# Patient Record
Sex: Female | Born: 1965 | Race: White | Hispanic: No | Marital: Married | Smoking: Former smoker
Health system: Southern US, Community
[De-identification: ages and names within clinical notes are randomized; demographics above are authoritative.]

## PROBLEM LIST (undated history)

## (undated) DIAGNOSIS — M8580 Other specified disorders of bone density and structure, unspecified site: Secondary | ICD-10-CM

## (undated) DIAGNOSIS — R9431 Abnormal electrocardiogram [ECG] [EKG]: Secondary | ICD-10-CM

## (undated) DIAGNOSIS — K589 Irritable bowel syndrome without diarrhea: Secondary | ICD-10-CM

## (undated) DIAGNOSIS — C801 Malignant (primary) neoplasm, unspecified: Secondary | ICD-10-CM

## (undated) DIAGNOSIS — K5790 Diverticulosis of intestine, part unspecified, without perforation or abscess without bleeding: Secondary | ICD-10-CM

## (undated) DIAGNOSIS — M810 Age-related osteoporosis without current pathological fracture: Secondary | ICD-10-CM

## (undated) DIAGNOSIS — M1990 Unspecified osteoarthritis, unspecified site: Secondary | ICD-10-CM

## (undated) DIAGNOSIS — K219 Gastro-esophageal reflux disease without esophagitis: Secondary | ICD-10-CM

## (undated) DIAGNOSIS — T7840XA Allergy, unspecified, initial encounter: Secondary | ICD-10-CM

## (undated) DIAGNOSIS — F32A Depression, unspecified: Secondary | ICD-10-CM

## (undated) DIAGNOSIS — F419 Anxiety disorder, unspecified: Secondary | ICD-10-CM

## (undated) DIAGNOSIS — N6320 Unspecified lump in the left breast, unspecified quadrant: Secondary | ICD-10-CM

## (undated) DIAGNOSIS — D126 Benign neoplasm of colon, unspecified: Secondary | ICD-10-CM

## (undated) DIAGNOSIS — E785 Hyperlipidemia, unspecified: Secondary | ICD-10-CM

## (undated) HISTORY — DX: Irritable bowel syndrome, unspecified: K58.9

## (undated) HISTORY — PX: OOPHORECTOMY: SHX86

## (undated) HISTORY — DX: Hyperlipidemia, unspecified: E78.5

## (undated) HISTORY — DX: Abnormal electrocardiogram (ECG) (EKG): R94.31

## (undated) HISTORY — DX: Diverticulosis of intestine, part unspecified, without perforation or abscess without bleeding: K57.90

## (undated) HISTORY — PX: TUBAL LIGATION: SHX77

## (undated) HISTORY — DX: Allergy, unspecified, initial encounter: T78.40XA

## (undated) HISTORY — DX: Benign neoplasm of colon, unspecified: D12.6

## (undated) HISTORY — PX: VAGINAL HYSTERECTOMY: SHX2639

## (undated) HISTORY — DX: Anxiety disorder, unspecified: F41.9

## (undated) HISTORY — DX: Age-related osteoporosis without current pathological fracture: M81.0

## (undated) HISTORY — DX: Depression, unspecified: F32.A

## (undated) HISTORY — DX: Malignant (primary) neoplasm, unspecified: C80.1

## (undated) HISTORY — PX: WISDOM TOOTH EXTRACTION: SHX21

## (undated) HISTORY — DX: Other specified disorders of bone density and structure, unspecified site: M85.80

## (undated) HISTORY — DX: Gastro-esophageal reflux disease without esophagitis: K21.9

## (undated) HISTORY — DX: Unspecified lump in the left breast, unspecified quadrant: N63.20

## (undated) HISTORY — DX: Unspecified osteoarthritis, unspecified site: M19.90

## (undated) HISTORY — PX: OTHER SURGICAL HISTORY: SHX169

---

## 2003-09-09 ENCOUNTER — Ambulatory Visit (HOSPITAL_COMMUNITY): Admission: RE | Admit: 2003-09-09 | Discharge: 2003-09-09 | Payer: Self-pay | Admitting: Family Medicine

## 2003-09-13 ENCOUNTER — Ambulatory Visit (HOSPITAL_COMMUNITY): Admission: RE | Admit: 2003-09-13 | Discharge: 2003-09-13 | Payer: Self-pay | Admitting: Family Medicine

## 2005-02-18 ENCOUNTER — Ambulatory Visit: Payer: Self-pay | Admitting: Orthopedic Surgery

## 2005-02-26 ENCOUNTER — Ambulatory Visit: Payer: Self-pay | Admitting: Orthopedic Surgery

## 2005-02-26 ENCOUNTER — Ambulatory Visit (HOSPITAL_COMMUNITY): Admission: RE | Admit: 2005-02-26 | Discharge: 2005-02-26 | Payer: Self-pay | Admitting: Orthopedic Surgery

## 2005-03-01 ENCOUNTER — Ambulatory Visit: Payer: Self-pay | Admitting: Orthopedic Surgery

## 2005-03-10 ENCOUNTER — Ambulatory Visit: Payer: Self-pay | Admitting: Orthopedic Surgery

## 2005-04-05 ENCOUNTER — Ambulatory Visit: Payer: Self-pay | Admitting: Orthopedic Surgery

## 2005-04-15 ENCOUNTER — Encounter (HOSPITAL_COMMUNITY): Admission: RE | Admit: 2005-04-15 | Discharge: 2005-05-15 | Payer: Self-pay | Admitting: Orthopedic Surgery

## 2005-05-03 ENCOUNTER — Ambulatory Visit: Payer: Self-pay | Admitting: Orthopedic Surgery

## 2005-09-27 ENCOUNTER — Ambulatory Visit: Payer: Self-pay | Admitting: Orthopedic Surgery

## 2005-10-27 ENCOUNTER — Ambulatory Visit (HOSPITAL_COMMUNITY): Admission: RE | Admit: 2005-10-27 | Discharge: 2005-10-27 | Payer: Self-pay | Admitting: Family Medicine

## 2005-11-08 ENCOUNTER — Ambulatory Visit (HOSPITAL_COMMUNITY): Admission: RE | Admit: 2005-11-08 | Discharge: 2005-11-08 | Payer: Self-pay | Admitting: Family Medicine

## 2005-11-17 ENCOUNTER — Ambulatory Visit (HOSPITAL_COMMUNITY): Admission: RE | Admit: 2005-11-17 | Discharge: 2005-11-17 | Payer: Self-pay | Admitting: Family Medicine

## 2006-05-25 ENCOUNTER — Ambulatory Visit (HOSPITAL_COMMUNITY): Admission: RE | Admit: 2006-05-25 | Discharge: 2006-05-25 | Payer: Self-pay | Admitting: Neurology

## 2006-11-24 ENCOUNTER — Ambulatory Visit (HOSPITAL_COMMUNITY): Admission: RE | Admit: 2006-11-24 | Discharge: 2006-11-24 | Payer: Self-pay | Admitting: Family Medicine

## 2007-02-19 IMAGING — CT CT HEAD W/O CM
1 series · 16 of 30 positions shown, 20 images · non-contrast
Comparison: none

CLINICAL DATA: Headaches

[Series 9049: — · axial · 0.48mm/px · z∈[-652,-517]mm · 16 of 30 slices shown, 20 images]
[im 2/30  brain]
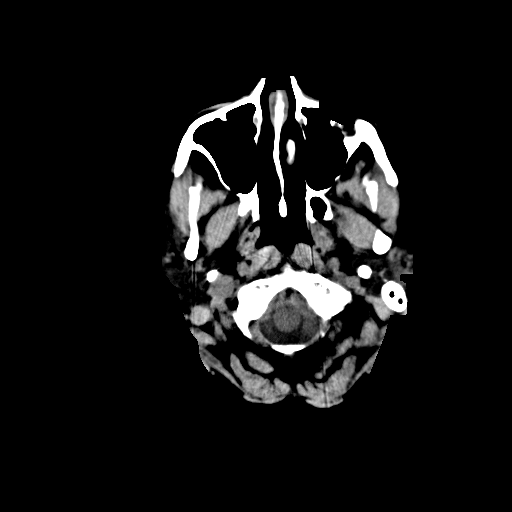
[im 2/30  bone]
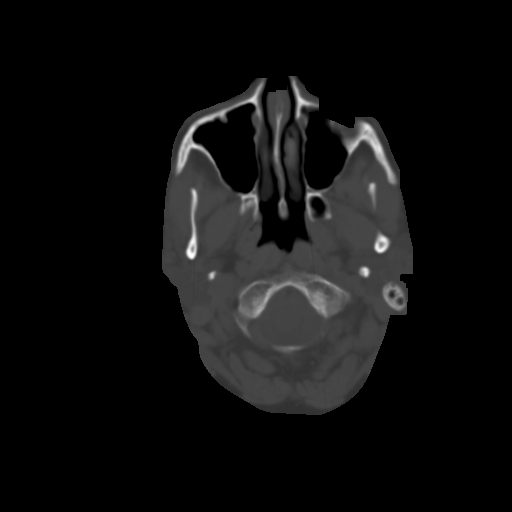
[im 4/30  brain]
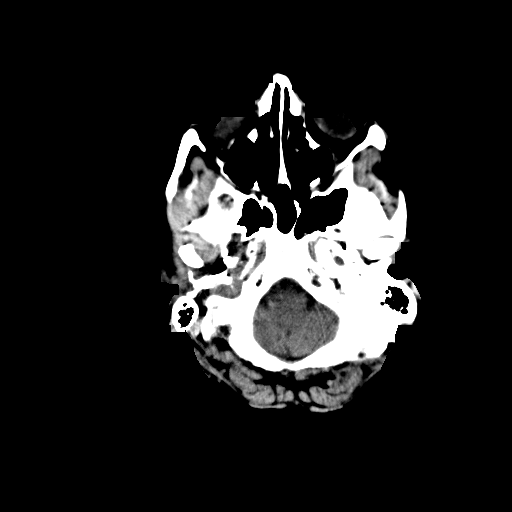
[im 6/30  brain]
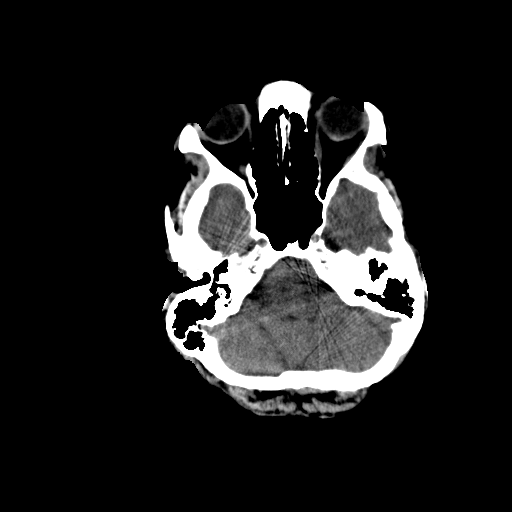
[im 8/30  brain]
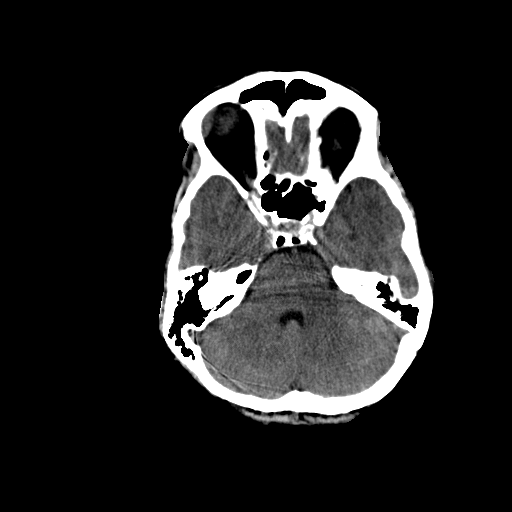
[im 9/30  brain]
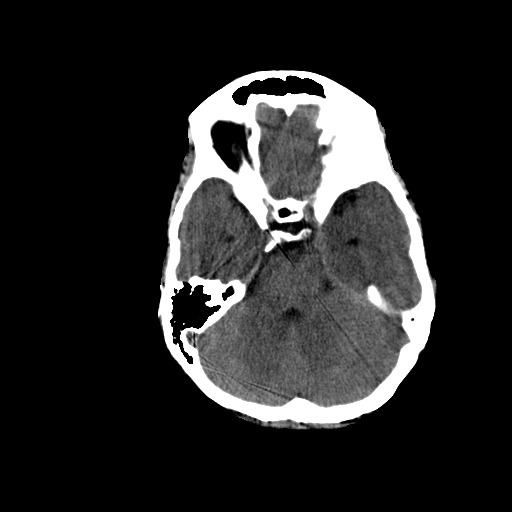
[im 9/30  bone]
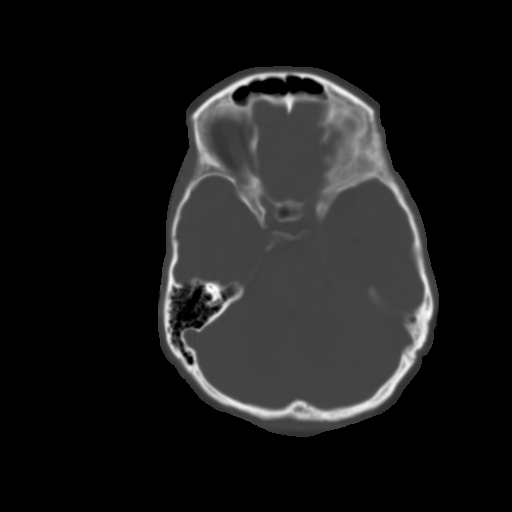
[im 11/30  brain]
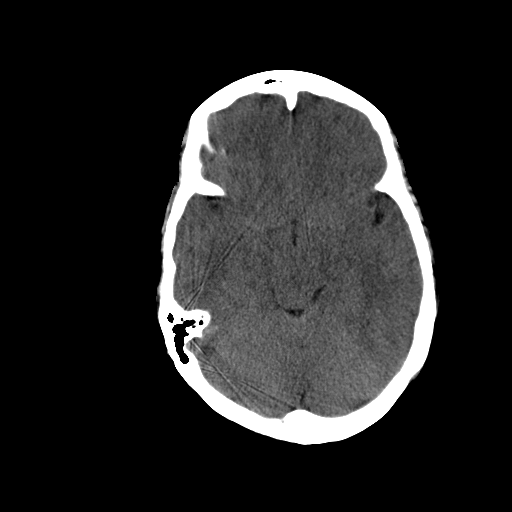
[im 13/30  brain]
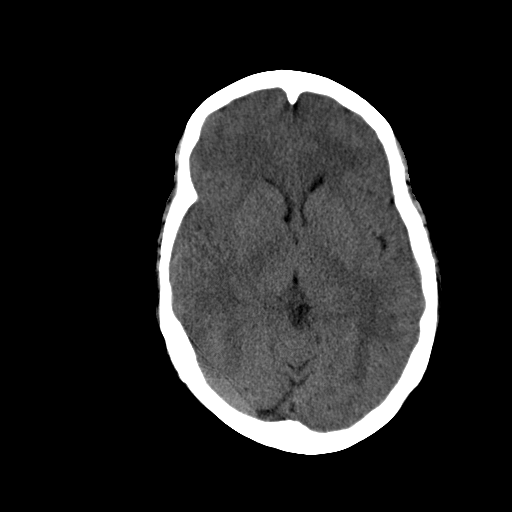
[im 15/30  brain]
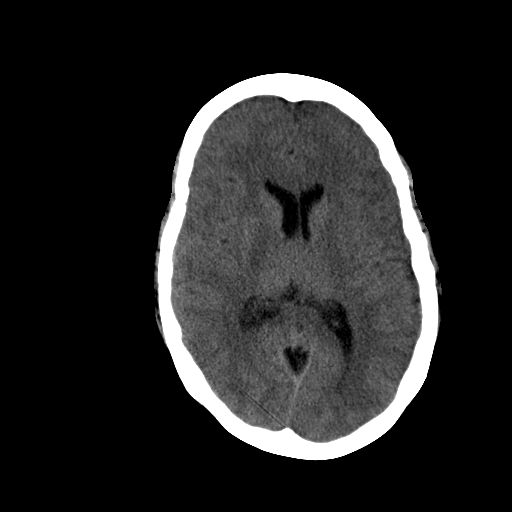
[im 16/30  brain]
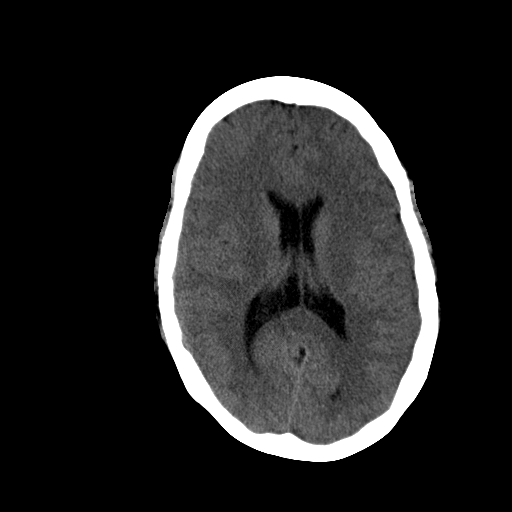
[im 16/30  bone]
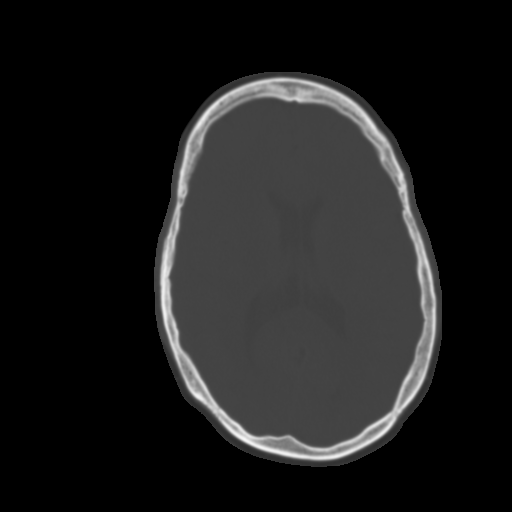
[im 18/30  brain]
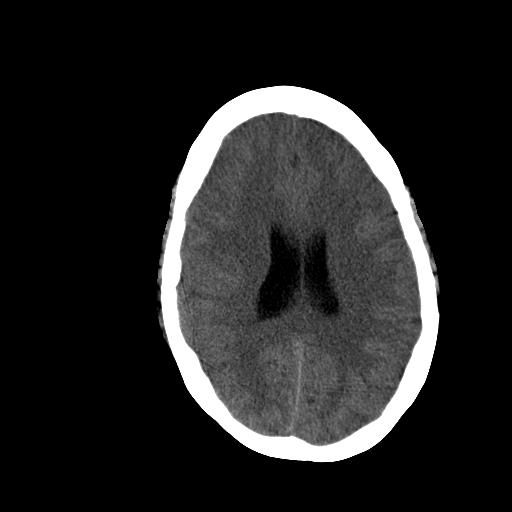
[im 20/30  brain]
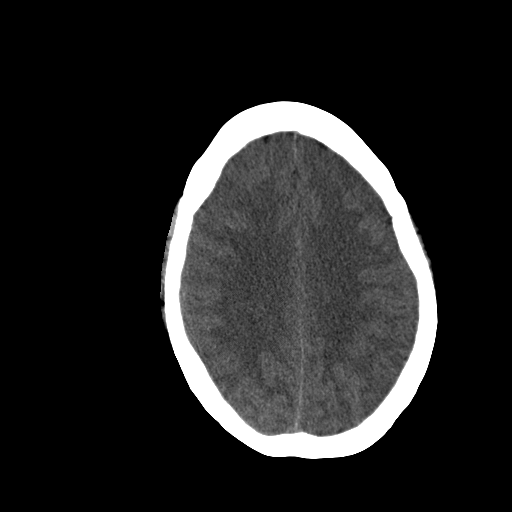
[im 22/30  brain]
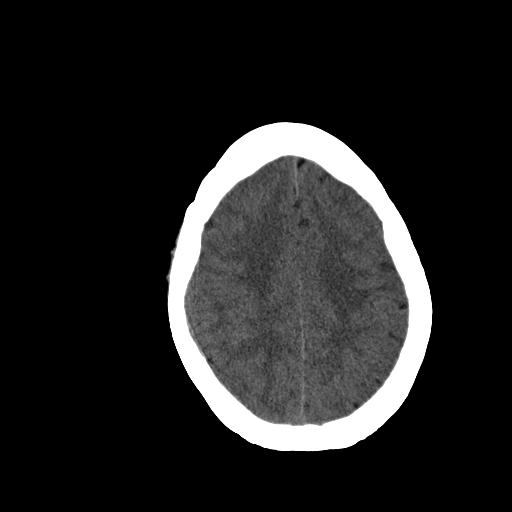
[im 23/30  brain]
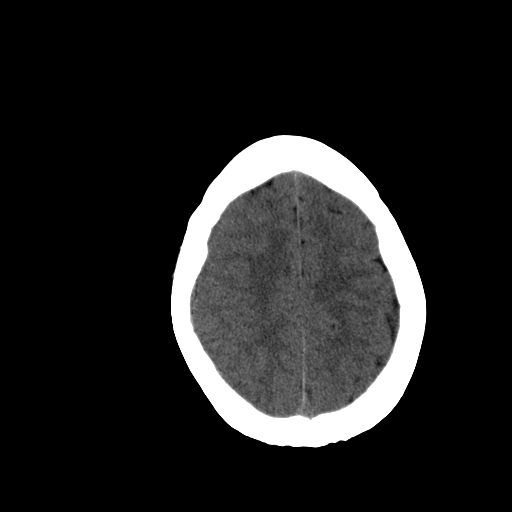
[im 23/30  bone]
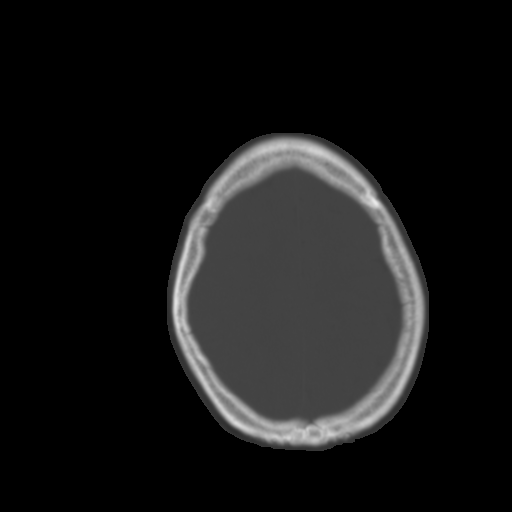
[im 25/30  brain]
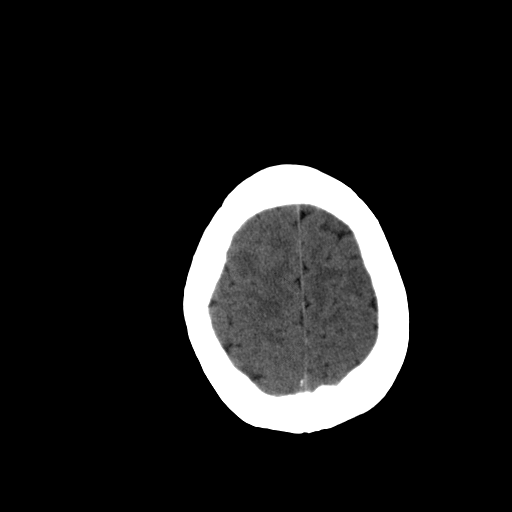
[im 27/30  brain]
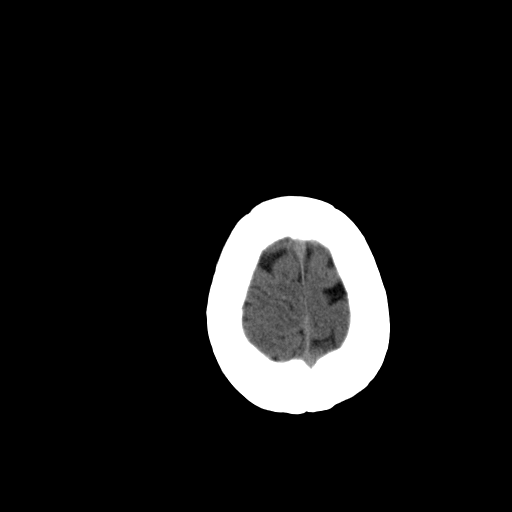
[im 29/30  brain]
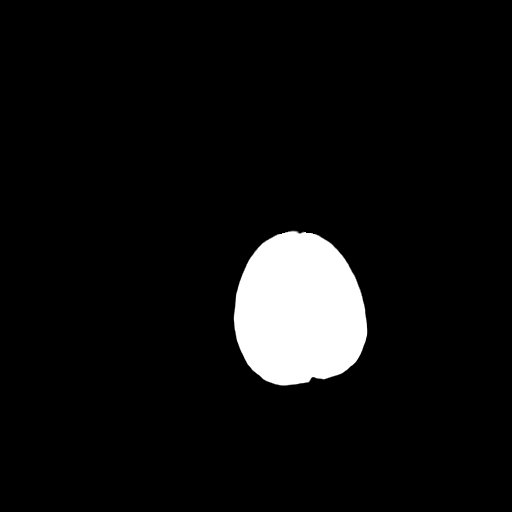

[16 of 30 positions shown; findings below may reference images not displayed]

CT head without contrast:

No previous for comparison. There is no evidence of acute intracranial
hemorrhage, brain edema,  mass effect, or midline shift.   No other intra-axial
abnormalities are seen, and the ventricles and sulci are within normal limits in
size and symmetry.   No abnormal extra-axial fluid collections or masses are
identified.  No skull abnormalities are noted.
IMPRESSION: 1. Negative non-contrast head CT.

## 2007-03-19 ENCOUNTER — Emergency Department: Payer: Self-pay | Admitting: Emergency Medicine

## 2009-08-05 ENCOUNTER — Ambulatory Visit (HOSPITAL_COMMUNITY): Admission: RE | Admit: 2009-08-05 | Discharge: 2009-08-05 | Payer: Self-pay | Admitting: Internal Medicine

## 2009-08-14 ENCOUNTER — Ambulatory Visit (HOSPITAL_COMMUNITY): Admission: RE | Admit: 2009-08-14 | Discharge: 2009-08-14 | Payer: Self-pay | Admitting: Internal Medicine

## 2009-11-21 ENCOUNTER — Ambulatory Visit (HOSPITAL_COMMUNITY): Admission: RE | Admit: 2009-11-21 | Discharge: 2009-11-21 | Payer: Self-pay | Admitting: Internal Medicine

## 2010-08-05 ENCOUNTER — Encounter: Payer: Self-pay | Admitting: Gastroenterology

## 2010-08-12 ENCOUNTER — Encounter: Payer: Self-pay | Admitting: Gastroenterology

## 2010-09-17 DIAGNOSIS — G43909 Migraine, unspecified, not intractable, without status migrainosus: Secondary | ICD-10-CM

## 2010-09-17 DIAGNOSIS — E785 Hyperlipidemia, unspecified: Secondary | ICD-10-CM

## 2010-09-17 DIAGNOSIS — K219 Gastro-esophageal reflux disease without esophagitis: Secondary | ICD-10-CM | POA: Insufficient documentation

## 2010-09-22 ENCOUNTER — Ambulatory Visit: Payer: Self-pay | Admitting: Gastroenterology

## 2010-09-22 ENCOUNTER — Encounter (INDEPENDENT_AMBULATORY_CARE_PROVIDER_SITE_OTHER): Payer: Self-pay | Admitting: *Deleted

## 2010-09-22 DIAGNOSIS — N390 Urinary tract infection, site not specified: Secondary | ICD-10-CM

## 2010-09-22 DIAGNOSIS — K589 Irritable bowel syndrome without diarrhea: Secondary | ICD-10-CM

## 2010-09-22 LAB — CONVERTED CEMR LAB
CRP, High Sensitivity: 12.16 — ABNORMAL HIGH (ref 0.00–5.00)
Ferritin: 34.7 ng/mL (ref 10.0–291.0)
Folate: 15.3 ng/mL
IgA: 148 mg/dL (ref 68–378)
Iron: 78 ug/dL (ref 42–145)
Saturation Ratios: 19.6 % — ABNORMAL LOW (ref 20.0–50.0)
Sed Rate: 18 mm/hr (ref 0–22)
Tissue Transglutaminase Ab, IgA: 14.5 units (ref <20)
Transferrin: 284.8 mg/dL (ref 212.0–360.0)
Vitamin B-12: 508 pg/mL (ref 211–911)

## 2010-09-23 ENCOUNTER — Encounter: Payer: Self-pay | Admitting: Gastroenterology

## 2010-11-09 ENCOUNTER — Ambulatory Visit
Admission: RE | Admit: 2010-11-09 | Discharge: 2010-11-09 | Payer: Self-pay | Source: Home / Self Care | Attending: Gastroenterology | Admitting: Gastroenterology

## 2010-11-09 ENCOUNTER — Encounter: Payer: Self-pay | Admitting: Gastroenterology

## 2010-11-09 HISTORY — PX: COLONOSCOPY: SHX174

## 2010-11-09 HISTORY — PX: POLYPECTOMY: SHX149

## 2010-11-13 ENCOUNTER — Encounter: Payer: Self-pay | Admitting: Gastroenterology

## 2010-11-21 ENCOUNTER — Encounter: Payer: Self-pay | Admitting: Family Medicine

## 2010-11-28 IMAGING — CR DG CHEST 2V
2 series · 2 of 2 positions shown · non-contrast
Comparison: None

CLINICAL DATA: Annual physical.

CHEST - 2 VIEW

[view not recorded (1 of 2)]
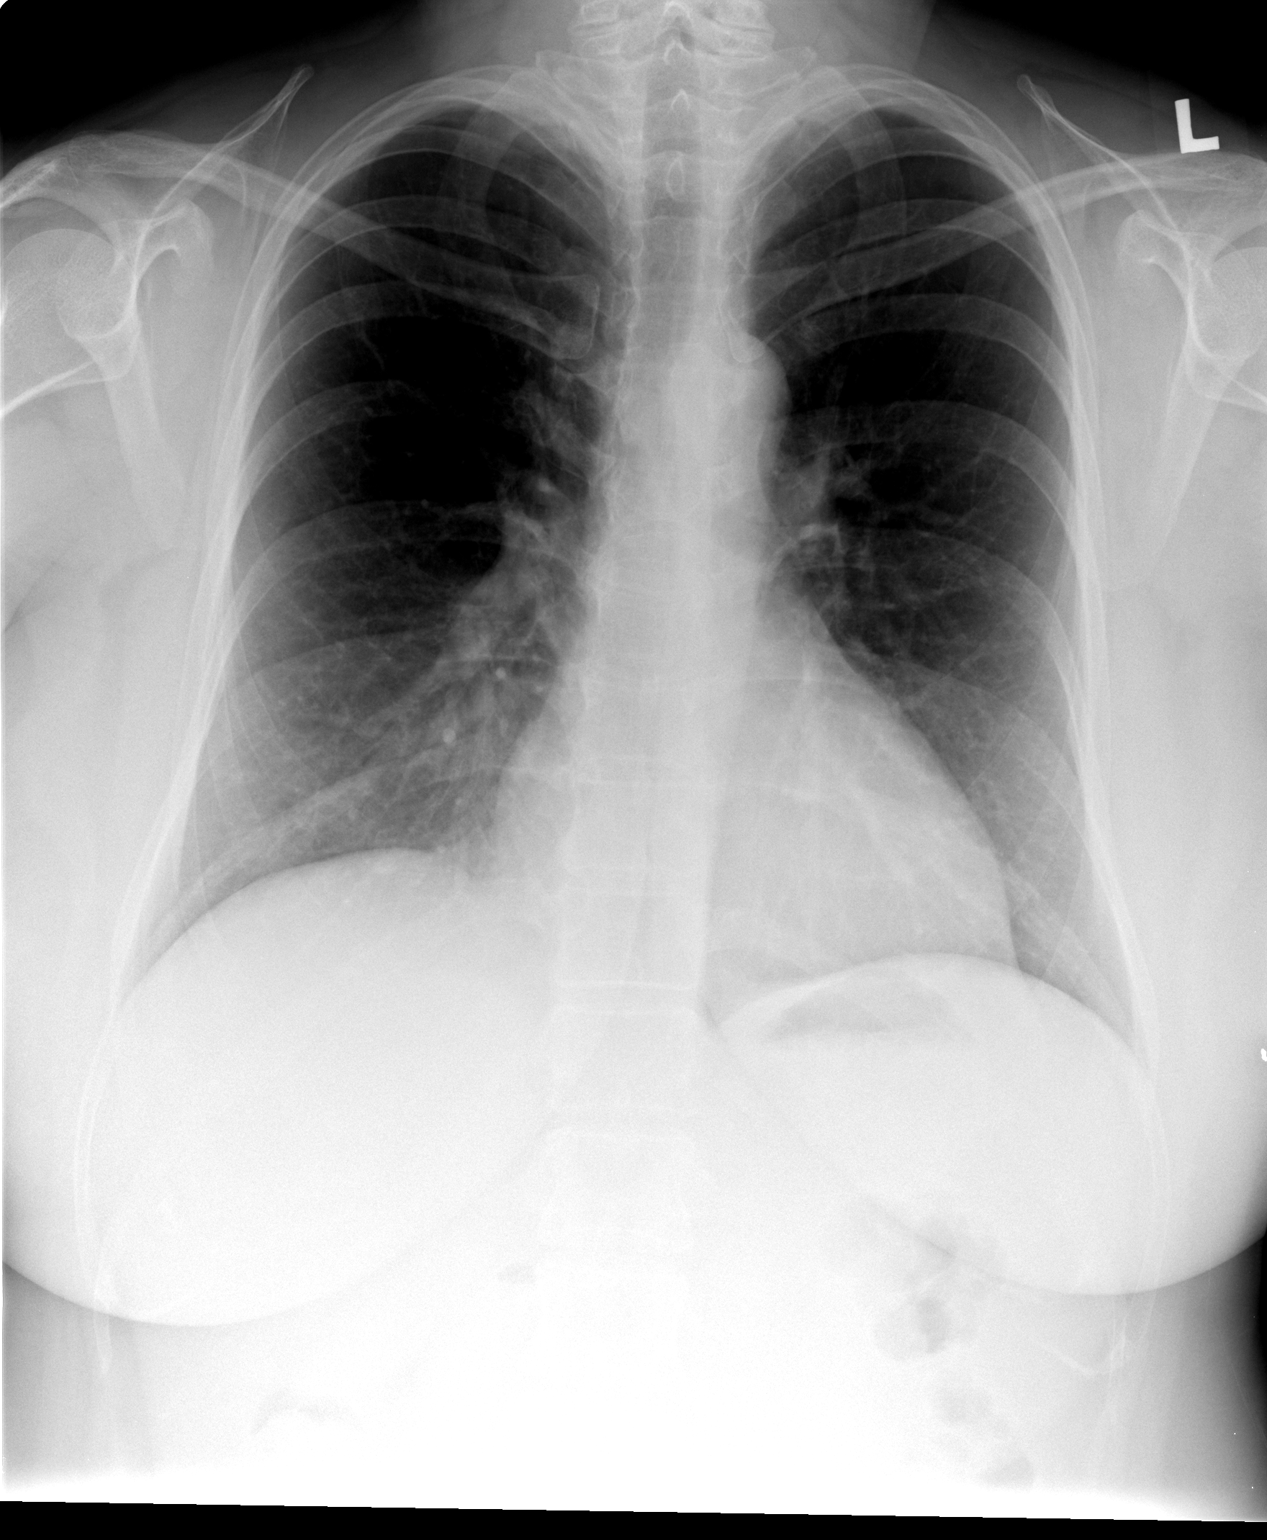

[view not recorded (2 of 2)]
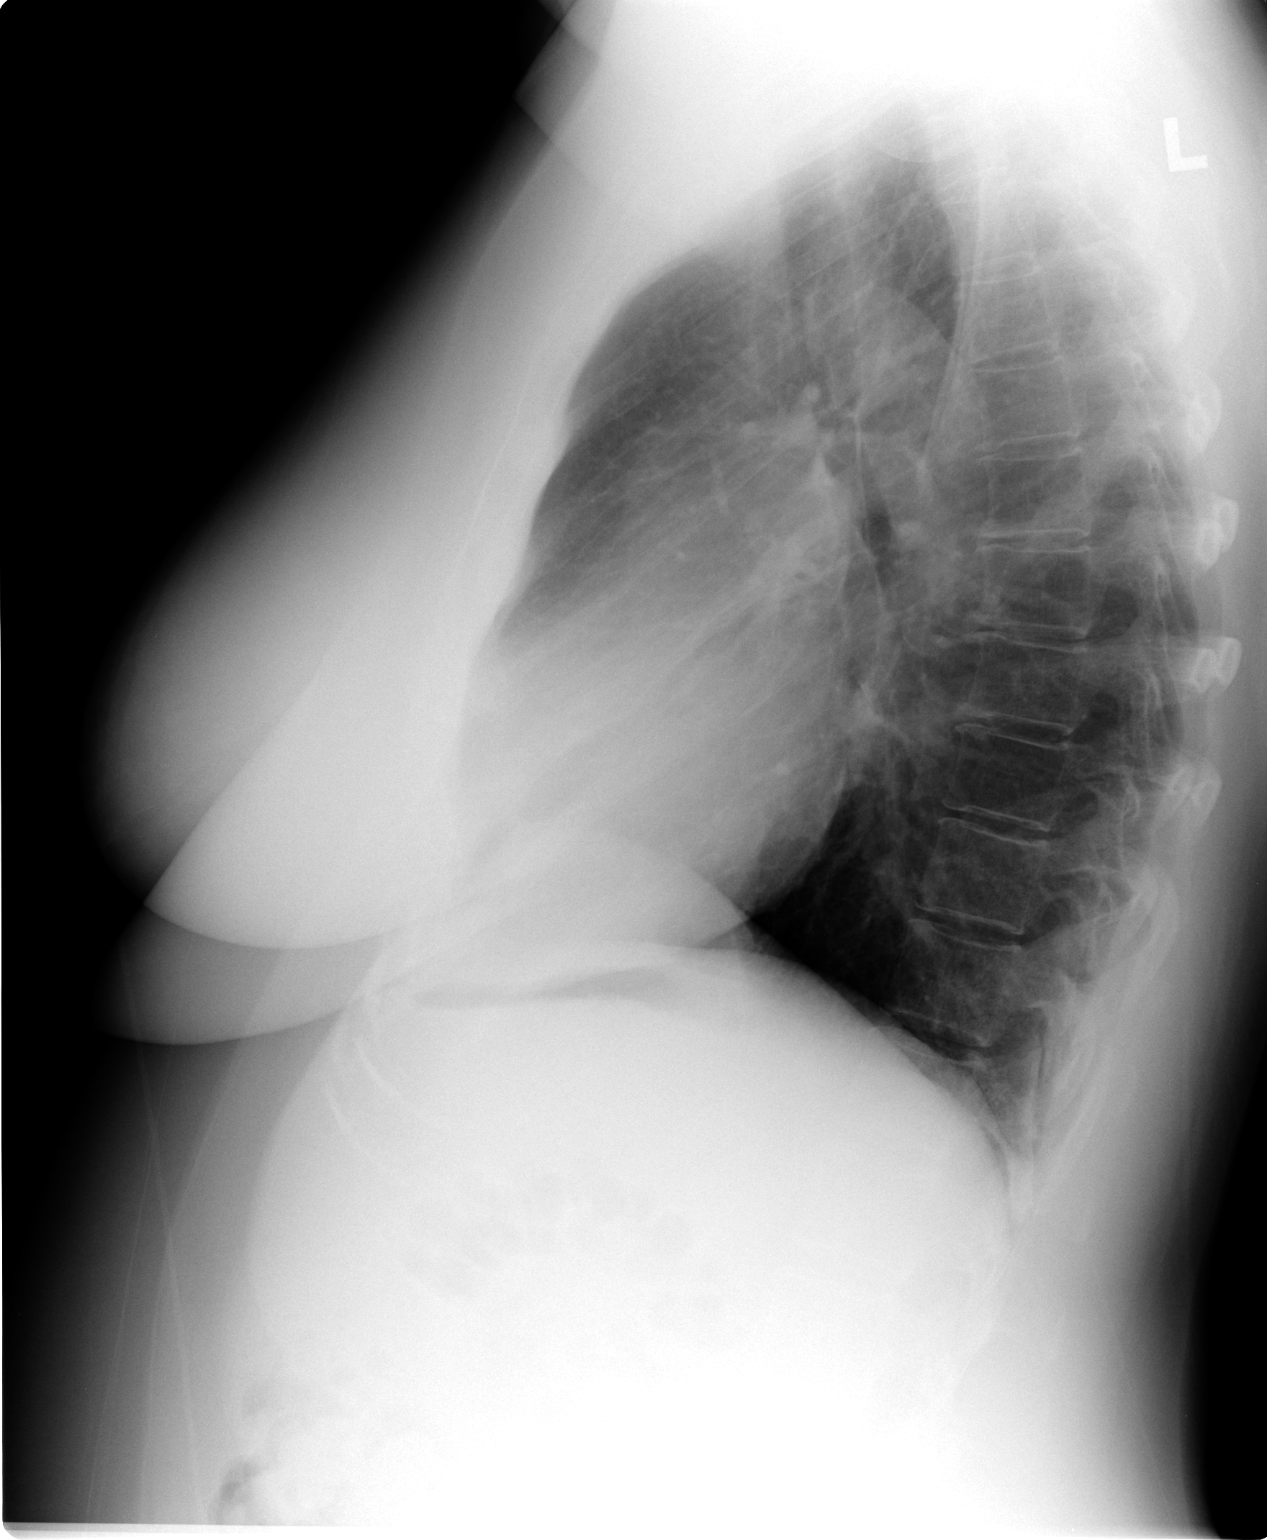

[2 of 2 positions shown; findings below may reference images not displayed]

FINDINGS: The heart size and mediastinal contours are within
normal limits.  Both lungs are clear.  The visualized skeletal
structures are unremarkable.
IMPRESSION: No active cardiopulmonary disease.

## 2010-12-01 NOTE — Letter (Signed)
Summary: [facility] Instructions  [facility]  [address] [place], [place] [address]   Phone: [phone]  Fax: [phone]       Kelly Mclaughlin    1965/11/25    MRN: [id number]        Procedure Day /Date: 10/05/2010 Monday     Arrival Time: 8:30am     Procedure Time: 9:30am     Location of Procedure:                    X  [facility] (4th Floor)   PREPARATION FOR COLONOSCOPY WITH MOVIPREP   Starting 5 days prior to your procedure 09/30/2010 do not eat nuts, seeds, popcorn, corn, beans, peas,  salads, or any raw vegetables.  Do not take any fiber supplements (e.g. Metamucil, Citrucel, and Benefiber).  THE DAY BEFORE YOUR PROCEDURE         DATE: 10/04/2010  DAY: Sunday  1.  Drink clear liquids the entire day-NO SOLID FOOD  2.  Do not drink anything colored red or purple.  Avoid juices with pulp.  No orange juice.  3.  Drink at least 64 oz. (8 glasses) of fluid/clear liquids during the day to prevent dehydration and help the prep work efficiently.  CLEAR LIQUIDS INCLUDE: Water Jello Ice Popsicles Tea (sugar ok, no milk/cream) Powdered fruit flavored drinks Coffee (sugar ok, no milk/cream) Gatorade Juice: apple, white grape, white cranberry  Lemonade Clear bullion, consomm, broth Carbonated beverages (any kind) Strained chicken noodle soup Hard Candy                             4.  In the morning, mix first dose of MoviPrep solution:    Empty 1 Pouch A and 1 Pouch B into the disposable container    Add lukewarm drinking water to the top line of the container. Mix to dissolve    Refrigerate (mixed solution should be used within 24 hrs)  5.  Begin drinking the prep at 5:00 p.m. The MoviPrep container is divided by 4 marks.   Every 15 minutes drink the solution down to the next mark (approximately 8 oz) until the full liter is complete.   6.  Follow completed prep with 16 oz of clear liquid of your choice (Nothing red or purple).   Continue to drink clear liquids until bedtime.  7.  Before going to bed, mix second dose of MoviPrep solution:    Empty 1 Pouch A and 1 Pouch B into the disposable container    Add lukewarm drinking water to the top line of the container. Mix to dissolve    Refrigerate  THE DAY OF YOUR PROCEDURE      DATE: 10/05/2010  DAY: Monday  Beginning at 4:30am (5 hours before procedure):         1. Every 15 minutes, drink the solution down to the next mark (approx 8 oz) until the full liter is complete.  2. Follow completed prep with 16 oz. of clear liquid of your choice.    3. You may drink clear liquids until 7:30am (2 HOURS BEFORE PROCEDURE).   MEDICATION INSTRUCTIONS  Unless otherwise instructed, you should take regular prescription medications with a small sip of water   as early as possible the morning of your procedure.           OTHER INSTRUCTIONS  You will need a responsible adult at least 45 years  of age to accompany you and drive you home.   This person must remain in the waiting room during your procedure.  Wear loose fitting clothing that is easily removed.  Leave jewelry and other valuables at home.  However, you may wish to bring a book to read or  an iPod/MP3 player to listen to music as you wait for your procedure to start.  Remove all body piercing jewelry and leave at home.  Total time from sign-in until discharge is approximately 2-3 hours.  You should go home directly after your procedure and rest.  You can resume normal activities the  day after your procedure.  The day of your procedure you should not:   Drive   Make legal decisions   Operate machinery   Drink alcohol   Return to work  You will receive specific instructions about eating, activities and medications before you leave.    The above instructions have been reviewed and explained to me by   _______________________    I fully understand and can verbalize these instructions  _____________________________ Date _________

## 2010-12-01 NOTE — Letter (Signed)
Summary: New Patient letter  [facility]  [address] [place], [place] [address]   Phone: [phone]  Fax: [phone]       08/12/2010 MRN: [id number]  Kelly Mclaughlin [address] [place], [place]  [address]  Dear [name],  Welcome to the Gastroenterology Division at [facility].    You are scheduled to see Dr.  Jarold Motto on 09-22-10 at 9am on the 3rd floor at [facility], [address]  We ask that you try to arrive at our office 15 minutes prior to your appointment time to allow for check-in.  We would like you to complete the enclosed self-administered evaluation form prior to your visit and bring it with you on the day of your appointment.  We will review it with you.  Also, please bring a complete list of all your medications or, if you prefer, bring the medication bottles and we will list them.  Please bring your insurance card so that we may make a copy of it.  If your insurance requires a referral to see a specialist, please bring your referral form from your primary care physician.  Co-payments are due at the time of your visit and may be paid by cash, check or credit card.     Your office visit will consist of a consult with your physician (includes a physical exam), any laboratory testing he/she may order, scheduling of any necessary diagnostic testing (e.g. x-ray, ultrasound, CT-scan), and scheduling of a procedure (e.g. Endoscopy, Colonoscopy) if required.  Please allow enough time on your schedule to allow for any/all of these possibilities.    If you cannot keep your appointment, please call [phone] to cancel or reschedule prior to your appointment date.  This allows [place] the opportunity to schedule an appointment for another patient in need of care.  If you do not cancel or reschedule by 5 p.m. the business day prior to your appointment date, you will be charged a $50.00 late cancellation/no-show fee.    Thank you for choosing  [facility] for your medical needs.  We appreciate the opportunity to care for you.  Please visit [place] at our website  to learn more about our practice.                     Sincerely,                                                             The Gastroenterology Division

## 2010-12-01 NOTE — Letter (Signed)
Summary: Loree Fee [facility]  Melissa Smith PAC   Imported By: Lester Alpha 09/29/2010 08:20:14  _____________________________________________________________________  External Attachment:    Type:   Image     Comment:   External Document

## 2010-12-01 NOTE — Assessment & Plan Note (Signed)
Summary: EVALUATE FOR DIARRHEA & GI SYMPTOMS/YF   History of Present Illness Visit Type: Initial Consult Primary GI MD: Sheryn Bison MD FACP FAGA Primary Hatice Bubel: Kelly Mclaughlin Requesting Kelly Mclaughlin: Kelly Mclaughlin Chief Complaint: diarrhea with some blood with wiping, Pt not sure if she has Hemorrhoids History of Present Illness:   45 year old referred for evaluation of several years of diarrhea and rectal urgency previously related to stress, now almost completely resolved with probiotic therapy after completing a course of Cipro therapy in mid October.  Kelly Mclaughlin has had stress-related loose stools and urgency for several years. She occasionally will have some bright red blood with wiping. Apparently she has had negative Hemoccult cards and also a negative celiac profile. Recent thyroid function tests, CBC, metabolic profile was normal and Dr. Wilhemena Durie lab. The patient denies any food intolerances or use of sorbitol or fructose. She has no associated significant abdominal pain, anorexia, weight loss, history of hepatitis or pancreatitis, or infectious disease exposure. What I can ascertain she has not had previous stool exams.  She denies fever, chills, skin rashes, joint pains, oral stomatitis, or other systemic complaints. She is currently on over-the-counter probiotic and also takes ranitidine 300 mg a day and iron tablets. She had a hysterectomy some 20 years ago. Her father has suffered from diverticulosis.   GI Review of Systems    Reports acid reflux, belching, bloating, heartburn, and  nausea.      Denies abdominal pain, chest pain, dysphagia with liquids, dysphagia with solids, loss of appetite, vomiting, vomiting blood, weight loss, and  weight gain.      Reports change in bowel habits, constipation, diarrhea, and  rectal bleeding.     Denies anal fissure, black tarry stools, diverticulosis, fecal incontinence, heme positive stool, hemorrhoids, irritable bowel syndrome,  jaundice, light color stool, liver problems, and  rectal pain. Preventive Screening-Counseling & Management      Drug Use:  no.      Current Medications (verified): 1)  Fish Oil  Oil (Fish Oil) .... 2 By Mouth Once Daily 2)  Iron 28 Mg Tabs (Ferrous Sulfate) .Marland Kitchen.. 1 By Mouth Once Daily 3)  Zyrtec Allergy 10 Mg Caps (Cetirizine Hcl) .Marland Kitchen.. 1 By Mouth Once Daily 4)  Ranitidine Hcl 300 Mg Caps (Ranitidine Hcl) .Marland Kitchen.. 1 By Mouth Once Daily 5)  Calcium/vitamin D/minerals 600-200 Mg-Unit Tabs (Calcium Carbonate-Vit D-Min) .... 2 By Mouth Once Daily 6)  Multi-Enzyme  Tabs (Digestive Enzymes) .Marland Kitchen.. 1 By Mouth Once Daily 7)  Estradiol 1 Mg Tabs (Estradiol) .Marland Kitchen.. 1 By Mouth Once Daily 8)  Probiotic  Caps (Probiotic Product) .Marland Kitchen.. 1 By Mouth Once Daily  Allergies (verified): 1)  ! Tylox 2)  ! Codeine  Past History:  Past medical, surgical, family and social histories (including risk factors) reviewed for relevance to current acute and chronic problems.  Past Medical History: Reviewed history from 09/17/2010 and no changes required. Current Problems:  DIARRHEA (ICD-787.91) GERD (ICD-530.81) MIGRAINE HEADACHE (ICD-346.90) HYPERCHOLESTEROLEMIA (ICD-272.0)  Past Surgical History: Reviewed history from 09/17/2010 and no changes required. Hysterectomy Tubal Ligation Laproscopy oophrectomy left thumb cyst removed  Family History: Reviewed history from 09/17/2010 and no changes required. Family History of Diabetes: mother Family History of Heart Disease: PGP Family History of Breast Cancer:mother Family History of Lung Cancer: uncle No FH of Colon Cancer:  Social History: Reviewed history from 09/17/2010 and no changes required. 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  Alcohol Use - yes occ Patient does not get regular exercise.  Daily  Caffeine Use  soda Illicit Drug Use - no Drug Use:  no  Review of Systems       The patient complains of allergy/sinus, fatigue, and  headaches-new.  The patient denies anemia, anxiety-new, arthritis/joint pain, back pain, blood in urine, breast changes/lumps, change in vision, confusion, cough, coughing up blood, depression-new, fainting, fever, hearing problems, heart murmur, heart rhythm changes, itching, menstrual pain, muscle pains/cramps, night sweats, nosebleeds, pregnancy symptoms, shortness of breath, skin rash, sleeping problems, sore throat, swelling of feet/legs, swollen lymph glands, thirst - excessive , urination - excessive , urination changes/pain, urine leakage, vision changes, and voice change.    Vital Signs:  Patient profile:   45 year old female Height:      64 inches Weight:      170 pounds BMI:     29.29 BSA:     1.83 Pulse rate:   78 / minute Pulse rhythm:   regular BP sitting:   104 / 82  (left arm)  Vitals Entered By: Merri Ray CMA Duncan Dull) (September 22, 2010 9:01 AM)  Physical Exam  General:  Well developed, well nourished, no acute distress.healthy appearing.   Head:  Normocephalic and atraumatic. Eyes:  PERRLA, no icterus.exam deferred to patient's ophthalmologist.   Neck:  Supple; no masses or thyromegaly. Lungs:  Clear throughout to auscultation. Heart:  Regular rate and rhythm; no murmurs, rubs,  or bruits. Abdomen:  Soft, nontender and nondistended. No masses, hepatosplenomegaly or hernias noted. Normal bowel sounds. Rectal:  refused by patient deferred until time of colonoscopy.   Extremities:  No clubbing, cyanosis, edema or deformities noted. Neurologic:  Alert and  oriented x4;  grossly normal neurologically. Cervical Nodes:  No significant cervical adenopathy. Psych:  Alert and cooperative. Normal mood and affect.   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 Stool exams for ova and parasite have been ordered along with some screening lab parameters including repeat celiac serologies. I have asked her to avoid sorbitol/fructose and other non-digestible carbohydrates. Because of the chronicity of her problems and her age, I do think screening colonoscopy is indicated. This will be performed after her stool exams have been completed for parasites and pathogens. Prescribed p.r.n. sublingual Levsin for abdominal cramps although this is not a major problem. Orders: TLB-B12, Serum-Total ONLY ([address]-U04) TLB-Ferritin (82728-FER) TLB-Folic Acid (Folate) (82746-FOL) TLB-IBC Pnl (Iron/FE;Transferrin) (83550-IBC) TLB-CRP-High Sensitivity (C-Reactive Protein) (86140-FCRP) TLB-Sedimentation Rate (ESR) (85652-ESR) T-Sprue Panel (Celiac Disease Aby Eval) (83516x3/[address]) TLB-IgA (Immunoglobulin A) (82784-IGA) T-Culture, Stool (87045/87046-70140) T-Stool Fats [place] Stain [phone]) T-Stool Giardia / Crypto- EIA (78295) T-Stool for O&P (62[address]-86578) T-Fecal WBC (46962-95284) Colonoscopy (Colon)  Problem # 2:  GERD (ICD-530.81) Assessment: Improved I doubt she is having any effect from ranitidine at this point and have discontinued his medication. I also had her stop her iron pills today pending repeat iron studies. B12 and folic acid levels also ordered  Problem # 3:  UTI (ICD-599.0) Assessment: Improved Apparently she has had repeat urine culture which was unremarkable, and she denies recurrent genitourinary symptomatology.  Patient Instructions: 1)  Copy sent to : Amy Stevenson,DO 2)  Stop your iron and your Ranitidine. 3)  Take Hyomax by mouth as needed. 4)  You were given a sheet today on artifical sweetners.  5)  Inflammatory  Bowel Disease brochure given.  6)  Please go to the basement today for your labs.  7)  Your prescription(s) have been sent to you pharmacy.  8)  Your  procedure has been scheduled for 10/05/2010, please follow the seperate instructions.  9)  The medication list was reviewed and reconciled.  All changed / newly prescribed medications were explained.  A complete medication list was provided to the patient / caregiver. Prescriptions: MOVIPREP 100 GM  SOLR (PEG-KCL-NACL-NASULF-NA ASC-C) As per prep instructions.  #1 x 0   Entered by:   Harlow Mares CMA (AAMA)   Authorized by:   Mardella Layman MD [facility]   Signed by:   Harlow Mares CMA (AAMA) on 09/22/2010   Method used:   Electronically to        [facility] (retail)       [address]       [place], [place]  [address]       Ph: [phone]       Fax: [phone]   RxID:   [id number] HYOMAX 0.125 MG TABS (HYOSCYAMINE SULFATE) take one by mouth two times a day as needed  #60 x 3   Entered by:   Harlow Mares CMA (AAMA)   Authorized by:   Mardella Layman MD [facility]   Signed by:   Harlow Mares CMA (AAMA) on 09/22/2010   Method used:   Electronically to        [facility] (retail)       [address]       [place], [place]  [address]       Ph: [phone]       Fax: [phone]   RxID:   [id number]

## 2010-12-03 NOTE — Letter (Signed)
Summary: Patient Notice- Polyp Results  [facility]  [address] [place], [place] [address]   Phone: [phone]  Fax: [phone]        November 13, 2010 MRN: [id number]    ARMONEE BOJANOWSKI [address] [place], [place]  [address]    Dear [name],  I am pleased to inform you that the colon polyp(s) removed during your recent colonoscopy was (were) found to be benign (no cancer detected) upon pathologic examination.  I recommend you have a repeat colonoscopy examination in 5_ years to look for recurrent polyps, as having colon polyps increases your risk for having recurrent polyps or even colon cancer in the future.  Should you develop new or worsening symptoms of abdominal pain, bowel habit changes or bleeding from the rectum or bowels, please schedule an evaluation with either your primary care physician or with me.  Additional information/recommendations:  __ No further action with gastroenterology is needed at this time. Please      follow-up with your primary care physician for your other healthcare      needs.  __ Please call [phone] to schedule a return visit to review your      situation.  __ Please keep your follow-up visit as already scheduled.  _xx_ Continue treatment plan as outlined the day of your exam.  Please call us if you are having persistent problems or have questions about your condition that have not been fully answered at this time.  Sincerely,  Mardella Layman MD [facility]  This letter has been electronically signed by your physician.  Appended Document: Patient Notice- Polyp Results Letter mailed

## 2010-12-03 NOTE — Procedures (Signed)
Summary: Colonoscopy  Patient: [name] Note: All result statuses are Final unless otherwise noted.  Tests: (1) Colonoscopy (COL)   COL Colonoscopy           DONE     [facility]     [address]     [place], [place]  [address]           COLONOSCOPY PROCEDURE REPORT           PATIENT:  Kelly Mclaughlin, Kelly Mclaughlin  MR#:  [id number]     BIRTHDATE:  [birth year], 45 yrs. old  GENDER:  female     ENDOSCOPIST:  Vania Rea. Jarold Motto, MD, [facility]     REF. BY:  Marisue Brooklyn, D.O.     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     MEDICATIONS:   Fentanyl 75 mcg IV, Versed 7 mg IV           DESCRIPTION OF PROCEDURE:   After the risks benefits and     alternatives of the procedure were thoroughly explained, informed     consent was obtained.  Digital rectal exam was performed and     revealed no abnormalities.   The LB [id number] endoscope     was introduced through the anus and advanced to the cecum, which     was identified by both the appendix and ileocecal valve, without     limitations.  The quality of the prep was excellent, using     MoviPrep.  The instrument was then slowly withdrawn as the colon     was fully examined.     <<PROCEDUREIMAGES>>           FINDINGS:  Moderate diverticulosis was found in the sigmoid to     descending colon segments. RANDOM COLON BIOPSIES EVERY 10 CM.     DONE.  There were multiple polyps identified and removed. in the     sigmoid to descending colon segments. 3-5MM POLYPS COLD SNARE     EXCISED FROM RECTO-SIGMOID AREA.  This was otherwise a normal     examination of the colon.   Retroflexed views in the rectum     revealed no abnormalities.    The scope was then withdrawn from     the patient and the procedure completed.           COMPLICATIONS:   None     ENDOSCOPIC IMPRESSION:     1) Moderate diverticulosis in the sigmoid to descending colon     segments     2) Polyps, multiple in the sigmoid to descending colon segments           3) Otherwise normal examination     1.R/O ADENOMAS     2.R/O MICROSCOPIC COLITIS/COLLAGENOUS COLITIS.     RECOMMENDATIONS:     1) Await biopsy results     2) Repeat colonoscopy in 5 years if polyp adenomatous; otherwise     10 years     REPEAT EXAM:  No           ______________________________     Vania Rea. Jarold Motto, MD, [facility]           CC:           n.  eSIGNED:   Vania Rea. Jamariyah Johannsen at 11/09/2010 11:19 AM           Loetta Rough, [id number]  Note: An exclamation mark (!) indicates a result that was not dispersed into the flowsheet. Document Creation Date: 11/09/2010 11:19 AM _______________________________________________________________________  (1) Order result status: Final Collection or observation date-time: 11/09/2010 11:09 Requested date-time:  Receipt date-time:  Reported date-time:  Referring Physician:   Ordering Physician: Sheryn Bison [phone]) Specimen Source:  Source: Launa Grill Order Number: [id number] Lab site:   Appended Document: Colonoscopy     Procedures Next Due Date:    Colonoscopy: 11/2015

## 2011-01-20 ENCOUNTER — Other Ambulatory Visit (HOSPITAL_COMMUNITY): Payer: Self-pay | Admitting: Internal Medicine

## 2011-01-20 DIAGNOSIS — Z1231 Encounter for screening mammogram for malignant neoplasm of breast: Secondary | ICD-10-CM

## 2011-03-18 ENCOUNTER — Other Ambulatory Visit: Payer: Self-pay | Admitting: Emergency Medicine

## 2011-03-18 ENCOUNTER — Other Ambulatory Visit (HOSPITAL_COMMUNITY)
Admission: RE | Admit: 2011-03-18 | Discharge: 2011-03-18 | Disposition: A | Discharge status: Home / Self Care | Payer: 59 | Source: Ambulatory Visit | Attending: Internal Medicine | Admitting: Internal Medicine

## 2011-03-18 ENCOUNTER — Ambulatory Visit (HOSPITAL_COMMUNITY)
Admission: RE | Admit: 2011-03-18 | Discharge: 2011-03-18 | Disposition: A | Discharge status: Home / Self Care | Payer: 59 | Source: Ambulatory Visit | Attending: Internal Medicine | Admitting: Internal Medicine

## 2011-03-18 DIAGNOSIS — Z1231 Encounter for screening mammogram for malignant neoplasm of breast: Secondary | ICD-10-CM | POA: Insufficient documentation

## 2011-03-18 DIAGNOSIS — Z01419 Encounter for gynecological examination (general) (routine) without abnormal findings: Secondary | ICD-10-CM | POA: Insufficient documentation

## 2011-03-19 NOTE — Op Note (Signed)
NAMEKATHALENE, SPORER NO.:  [id number]   MEDICAL RECORD NO.:  [id number]          PATIENT TYPE:  AMB   LOCATION:  DAY                           FACILITY:  [facility]   PHYSICIAN:  Vickki Hearing, M.D.DATE OF BIRTH:  12-Apr-1966   DATE OF PROCEDURE:  DATE OF DISCHARGE:                                 OPERATIVE REPORT   HISTORY:  A 45 year old female with nodule over the left thumb A1 pulley.   PREOPERATIVE DIAGNOSIS:  Ganglion, left thumb.   POSTOPERATIVE DIAGNOSIS:  Flexor tenosynovitis and ganglion, left thumb.   PROCEDURE:  A1 pulley release, decompression of ganglion cyst, left thumb.   SURGEON:  Dr. Romeo Apple. No assistants.   ANESTHESIA:  Bier block.   FINDINGS:  Flexor tenosynovitis and a ganglion cyst of tendon sheath, mild  fraying of the tendon at the site of the ganglion.   TOURNIQUET TIME:  24 minutes.   DETAILS OF PROCEDURE:  Ms. Montez Morita was identified in the preoperative holding  area. Left thumb marked at surgical site, countersigned by the surgeon.  Ancef was given. History and physical was updated. The patient was taken to  the operating room for a bier block tolerated that well. Had a sterile prep  and drape of the left lower extremity, after which we took a time out.  Procedure was confirmed as a ganglion cyst release of left thumb, Loetta Rough. Antibiotics confirmed to be given within an hour of skin incision.   Traverse incision was made over the metacarpal phalangeal joint crease.  Ganglion was encountered in the subcu tissue, tracked down into the flexor  tendon. At that point, it was noted that the flexor tendon was frayed, and  the cyst was actually coming from the flexor tendon sheath. A1 pulley was  released. Tendon was debrided. Wound was irrigated and closed with 3-0 nylon  suture, injected with 8 cc of plain half percent Marcaine. A sterile  dressing was applied. Tourniquet was released. Pressure was held, and the  patient was  taken to the recovery room in stable condition. Followup on  Monday.    SEH/MEDQ  D:  02/26/2005  T:  02/26/2005  Job:  [id number]

## 2011-03-19 NOTE — H&P (Signed)
Kelly Mclaughlin, BREMNER NO.:  [id number]   MEDICAL RECORD NO.:  [id number]          PATIENT TYPE:  AMB   LOCATION:  DAY                           FACILITY:  [facility]   PHYSICIAN:  Vickki Hearing, M.D.DATE OF BIRTH:  08/24/1966   DATE OF ADMISSION:  DATE OF DISCHARGE:  LH                                HISTORY & PHYSICAL   CHIEF COMPLAINT:  Mass, left thumb.   HISTORY OF PRESENT ILLNESS:  This patient has a nodule over her left thumb.  It is bothering her; it is affecting her normal activities.  It is tender  and painful.  She wishes to have it removed.   OTHER HISTORY:  History of osteopenia.   REVIEW OF SYSTEMS:  Normal review of systems.   ALLERGIES:  1.  TYLOX.  2.  CODEINE.   PREVIOUS SURGERY:  1.  Tubal ligation.  2.  Hysterectomy.   CURRENT MEDICATIONS:  1.  BC powder as needed.  2.  Calcium 1200 mg.  3.  Vivelle patch, believed to be estrogen.   FAMILY HISTORY:  1.  Heart disease.  2.  Cancer.   FAMILY PHYSICIAN:  [facility].   SOCIAL HISTORY:  Married.  Employed at [employer].   SOCIAL HISTORY:  Smoking - yes, one pack a day or more.  Alcohol use - no.  Caffeine use - yes, approximately 2 liters of soda per day.  Highest grade  completed - 12, plus cosmetology school.   PHYSICAL EXAMINATION:  VITAL SIGNS:  Weight 130.  Pulse 78, respiratory rate  18.  GENERAL APPEARANCE:  Ectomorphic body habitus.  Normal development,  grooming, and hygiene.  CARDIOVASCULAR:  Pulses are normal.  LYMPHATIC SYSTEM:  No lymphadenopathy.  SKIN:  Nodule noted, volar aspect, left thumb, palpable and tender.  Does  not affect flexion.  EXTREMITIES:  Range of motion of the thumb is normal.  Sensation is intact.  All ligaments surrounding the ulna collaterals and radial collaterals are  stable.  Pinch is strong at 5/5.  Sensation is intact.  She is alert and  oriented x3.  Mood is normal.   IMPRESSION:  Ganglion versus nodule, left thumb.   PLAN:  Excision of nodule, left thumb.      SEH/MEDQ  D:  02/25/2005  T:  02/25/2005  Job:  [id number]

## 2011-08-11 ENCOUNTER — Other Ambulatory Visit (HOSPITAL_COMMUNITY): Payer: Self-pay | Admitting: Internal Medicine

## 2011-08-11 DIAGNOSIS — M8580 Other specified disorders of bone density and structure, unspecified site: Secondary | ICD-10-CM

## 2011-08-20 ENCOUNTER — Ambulatory Visit (HOSPITAL_COMMUNITY): Payer: 59

## 2011-09-01 ENCOUNTER — Encounter: Payer: Self-pay | Admitting: *Deleted

## 2011-09-03 ENCOUNTER — Ambulatory Visit (INDEPENDENT_AMBULATORY_CARE_PROVIDER_SITE_OTHER): Payer: 59 | Admitting: Cardiology

## 2011-09-03 ENCOUNTER — Encounter: Payer: Self-pay | Admitting: Cardiology

## 2011-09-03 VITALS — BP 132/70 | Resp 18 | Ht 65.0 in | Wt 170.1 lb

## 2011-09-03 DIAGNOSIS — E663 Overweight: Secondary | ICD-10-CM

## 2011-09-03 DIAGNOSIS — R9431 Abnormal electrocardiogram [ECG] [EKG]: Secondary | ICD-10-CM

## 2011-09-03 DIAGNOSIS — E7800 Pure hypercholesterolemia, unspecified: Secondary | ICD-10-CM

## 2011-09-03 HISTORY — DX: Abnormal electrocardiogram (ECG) (EKG): R94.31

## 2011-09-03 NOTE — Progress Notes (Signed)
HPI The patient presents for evaluation of an abnormal EKG. This is described below. She says she's had this for quite a while. I have a recent EKG from her primary provider but no distant EKGs. She has no prior cardiac history otherwise. She doesn't exercise but she does her household chores and activities of daily living. With this she denies any chest pressure, neck or arm discomfort. She denies any palpitations, presyncope or syncope. She has no shortness of breath, PND or orthopnea. She's had no weight gain or edema.  Allergies  Allergen Reactions  . Codeine   . Fioricet (Butalbital-Apap-Caffeine)   . Imitrex (Sumatriptan Base)   . Oxycodone-Acetaminophen   . Tylox     hallucinate     Current Outpatient Prescriptions  Medication Sig Dispense Refill  . butalbital-acetaminophen-caffeine (FIORICET, ESGIC) 50-325-40 MG per tablet Take 1 tablet by mouth Every 4 hours.      . Calcium Carbonate-Vitamin D (CALCIUM 600 + D PO) Take by mouth 2 (two) times daily.       . ciclesonide (OMNARIS) 50 MCG/ACT nasal spray Place 2 sprays into both nostrils daily.        Marland Kitchen estradiol (ESTRACE) 1 MG tablet Take 1 tablet by mouth daily.      . fish oil-omega-3 fatty acids 1000 MG capsule Take 2 g by mouth daily.        . fluticasone (VERAMYST) 27.5 MCG/SPRAY nasal spray Place into the nose daily.        . Probiotic Product (PROBIOTIC PO) Take by mouth daily.        . rosuvastatin (CRESTOR) 10 MG tablet Take 10 mg by mouth daily.        Cecille Rubin DOSEPRO 6 MG/0.5ML DEVI as directed.      . fexofenadine (ALLEGRA) 180 MG tablet Take 180 mg by mouth daily as needed.        . nystatin (MYCOSTATIN) powder Apply topically as needed.          Past Medical History  Diagnosis Date  . IBS (irritable bowel syndrome)   . GERD (gastroesophageal reflux disease)   . Migraine   . HLD (hyperlipidemia)   . Obesity     Past Surgical History  Procedure Date  . Hysterectomy - unknown type   . Oophorectomy   .  Tubal ligation     Family History  Problem Relation Age of Onset  . Heart disease    . Hypertension    . Stroke    . Diabetes    . Cancer    . Thyroid disease    . Hyperlipidemia    . Kidney disease      History   Social History  . Marital Status: Single    Spouse Name: N/A    Number of Children: N/A  . Years of Education: N/A   Occupational History  . Not on file.   Social History Main Topics  . Smoking status: Former Smoker    Quit date: 11/03/2007  . Smokeless tobacco: Not on file  . Alcohol Use: Not on file  . Drug Use: Not on file  . Sexually Active: Not on file   Other Topics Concern  . Not on file   Social History Narrative  . No narrative on file    ROS:  Headaches, sinus trouble, previous reflux. Otherwise as stated in the HPI and negative for all other systems.   PHYSICAL EXAM BP 132/70  Resp 18  Ht 5\' 5"  (1.651  m)  Wt 170 lb 1.9 oz (77.166 kg)  BMI 28.31 kg/m2 GENERAL:  Well appearing HEENT:  Pupils equal round and reactive, fundi not visualized, oral mucosa unremarkable NECK:  No jugular venous distention, waveform within normal limits, carotid upstroke brisk and symmetric, no bruits, no thyromegaly LYMPHATICS:  No cervical, inguinal adenopathy LUNGS:  Clear to auscultation bilaterally BACK:  No CVA tenderness CHEST:  Unremarkable HEART:  PMI not displaced or sustained,S1 and S2 within normal limits, no S3, no S4, no clicks, no rubs, no murmurs ABD:  Flat, positive bowel sounds normal in frequency in pitch, no bruits, no rebound, no guarding, no midline pulsatile mass, no hepatomegaly, no splenomegaly EXT:  2 plus pulses throughout, no edema, no cyanosis no clubbing SKIN:  No rashes no nodules NEURO:  Cranial nerves II through XII grossly intact, motor grossly intact throughout PSYCH:  Cognitively intact, oriented to person place and time  EKG:  Sinus rhythm, rate 83, left ventricular hypertrophy, left atrial enlargement, inferolateral T  wave inversions, RSR prime V1 and V2, QTC prolonged  ASSESSMENT AND PLAN

## 2011-09-03 NOTE — Patient Instructions (Signed)
Your physician has requested that you have an echocardiogram. Echocardiography is a painless test that uses sound waves to create images of your heart. It provides your doctor with information about the size and shape of your heart and how well your heart's chambers and valves are working. This procedure takes approximately one hour. There are no restrictions for this procedure.  Follow as needed after your testing

## 2011-09-03 NOTE — Assessment & Plan Note (Signed)
The patient understands the need to lose weight with diet and exercise. We have discussed specific strategies for this.  

## 2011-09-03 NOTE — Assessment & Plan Note (Signed)
She apparently had a very abnormal lipomed profile.  I don't have this and I will defer management to her primary team.

## 2011-09-03 NOTE — Assessment & Plan Note (Signed)
I don't strongly suspect structural heart disease.  However it is a significantly abnormal EKG. I will perform an echocardiogram to further evaluate. I would like to try to get some older EKGs as well. Further evaluation will be based on this.

## 2011-09-17 ENCOUNTER — Ambulatory Visit (HOSPITAL_COMMUNITY): Payer: 59 | Attending: Internal Medicine | Admitting: Radiology

## 2011-09-17 DIAGNOSIS — E785 Hyperlipidemia, unspecified: Secondary | ICD-10-CM | POA: Insufficient documentation

## 2011-09-17 DIAGNOSIS — I079 Rheumatic tricuspid valve disease, unspecified: Secondary | ICD-10-CM | POA: Insufficient documentation

## 2011-09-17 DIAGNOSIS — R9431 Abnormal electrocardiogram [ECG] [EKG]: Secondary | ICD-10-CM | POA: Insufficient documentation

## 2011-09-17 DIAGNOSIS — I059 Rheumatic mitral valve disease, unspecified: Secondary | ICD-10-CM | POA: Insufficient documentation

## 2011-10-12 ENCOUNTER — Other Ambulatory Visit: Payer: Self-pay | Admitting: Emergency Medicine

## 2012-03-08 ENCOUNTER — Other Ambulatory Visit (HOSPITAL_COMMUNITY): Payer: Self-pay | Admitting: Internal Medicine

## 2012-03-08 DIAGNOSIS — Z1231 Encounter for screening mammogram for malignant neoplasm of breast: Secondary | ICD-10-CM

## 2012-03-31 ENCOUNTER — Ambulatory Visit (HOSPITAL_COMMUNITY)
Admission: RE | Admit: 2012-03-31 | Discharge: 2012-03-31 | Disposition: A | Discharge status: Home / Self Care | Payer: 59 | Source: Ambulatory Visit | Attending: Internal Medicine | Admitting: Internal Medicine

## 2012-03-31 DIAGNOSIS — Z1231 Encounter for screening mammogram for malignant neoplasm of breast: Secondary | ICD-10-CM

## 2012-08-25 ENCOUNTER — Ambulatory Visit (HOSPITAL_COMMUNITY)
Admission: RE | Admit: 2012-08-25 | Discharge: 2012-08-25 | Disposition: A | Discharge status: Home / Self Care | Payer: 59 | Source: Ambulatory Visit | Attending: Internal Medicine | Admitting: Internal Medicine

## 2012-08-25 DIAGNOSIS — Z13820 Encounter for screening for osteoporosis: Secondary | ICD-10-CM | POA: Insufficient documentation

## 2012-08-25 DIAGNOSIS — M899 Disorder of bone, unspecified: Secondary | ICD-10-CM | POA: Insufficient documentation

## 2012-08-25 DIAGNOSIS — M8580 Other specified disorders of bone density and structure, unspecified site: Secondary | ICD-10-CM

## 2013-03-21 ENCOUNTER — Other Ambulatory Visit (HOSPITAL_COMMUNITY): Payer: Self-pay | Admitting: Emergency Medicine

## 2013-03-21 DIAGNOSIS — Z1231 Encounter for screening mammogram for malignant neoplasm of breast: Secondary | ICD-10-CM

## 2013-04-02 ENCOUNTER — Ambulatory Visit (HOSPITAL_COMMUNITY)
Admission: RE | Admit: 2013-04-02 | Discharge: 2013-04-02 | Disposition: A | Discharge status: Home / Self Care | Payer: 59 | Source: Ambulatory Visit | Attending: Emergency Medicine | Admitting: Emergency Medicine

## 2013-04-02 DIAGNOSIS — Z1231 Encounter for screening mammogram for malignant neoplasm of breast: Secondary | ICD-10-CM | POA: Insufficient documentation

## 2013-08-22 ENCOUNTER — Ambulatory Visit (HOSPITAL_COMMUNITY)
Admission: RE | Admit: 2013-08-22 | Discharge: 2013-08-22 | Disposition: A | Discharge status: Home / Self Care | Payer: 59 | Source: Ambulatory Visit | Attending: Emergency Medicine | Admitting: Emergency Medicine

## 2013-08-22 ENCOUNTER — Other Ambulatory Visit (HOSPITAL_COMMUNITY): Payer: Self-pay | Admitting: Emergency Medicine

## 2013-08-22 DIAGNOSIS — Z1231 Encounter for screening mammogram for malignant neoplasm of breast: Secondary | ICD-10-CM

## 2013-08-22 DIAGNOSIS — R059 Cough, unspecified: Secondary | ICD-10-CM | POA: Insufficient documentation

## 2013-08-22 DIAGNOSIS — R05 Cough: Secondary | ICD-10-CM | POA: Insufficient documentation

## 2013-09-17 ENCOUNTER — Other Ambulatory Visit: Payer: Self-pay | Admitting: Emergency Medicine

## 2013-09-18 ENCOUNTER — Other Ambulatory Visit: Payer: 59

## 2013-09-18 DIAGNOSIS — R748 Abnormal levels of other serum enzymes: Secondary | ICD-10-CM

## 2013-09-18 LAB — HEPATIC FUNCTION PANEL
ALT: 41 U/L — ABNORMAL HIGH (ref 0–35)
Alkaline Phosphatase: 78 U/L (ref 39–117)
Bilirubin, Direct: 0.1 mg/dL (ref 0.0–0.3)
Indirect Bilirubin: 0.2 mg/dL (ref 0.0–0.9)
Total Protein: 6.6 g/dL (ref 6.0–8.3)

## 2014-02-19 ENCOUNTER — Other Ambulatory Visit: Payer: Self-pay | Admitting: Emergency Medicine

## 2014-02-19 DIAGNOSIS — M25561 Pain in right knee: Secondary | ICD-10-CM

## 2014-02-22 ENCOUNTER — Ambulatory Visit
Admission: RE | Admit: 2014-02-22 | Discharge: 2014-02-22 | Disposition: A | Discharge status: Home / Self Care | Payer: 59 | Source: Ambulatory Visit | Attending: Emergency Medicine | Admitting: Emergency Medicine

## 2014-02-22 DIAGNOSIS — M25561 Pain in right knee: Secondary | ICD-10-CM

## 2014-03-06 ENCOUNTER — Other Ambulatory Visit: Payer: Self-pay | Admitting: Emergency Medicine

## 2014-03-06 DIAGNOSIS — Z1231 Encounter for screening mammogram for malignant neoplasm of breast: Secondary | ICD-10-CM

## 2014-04-05 ENCOUNTER — Ambulatory Visit (HOSPITAL_COMMUNITY)
Admission: RE | Admit: 2014-04-05 | Discharge: 2014-04-05 | Disposition: A | Discharge status: Home / Self Care | Payer: 59 | Source: Ambulatory Visit | Attending: Emergency Medicine | Admitting: Emergency Medicine

## 2014-04-05 DIAGNOSIS — Z1231 Encounter for screening mammogram for malignant neoplasm of breast: Secondary | ICD-10-CM | POA: Insufficient documentation

## 2014-04-24 ENCOUNTER — Other Ambulatory Visit: Payer: Self-pay | Admitting: Emergency Medicine

## 2014-04-30 ENCOUNTER — Other Ambulatory Visit: Payer: Self-pay | Admitting: Emergency Medicine

## 2014-04-30 ENCOUNTER — Encounter: Payer: Self-pay | Admitting: Emergency Medicine

## 2014-04-30 ENCOUNTER — Ambulatory Visit (INDEPENDENT_AMBULATORY_CARE_PROVIDER_SITE_OTHER): Payer: 59 | Admitting: Emergency Medicine

## 2014-04-30 VITALS — BP 112/78 | HR 84 | Temp 97.9°F | Resp 16 | Ht 63.5 in | Wt 160.0 lb

## 2014-04-30 DIAGNOSIS — Z79899 Other long term (current) drug therapy: Secondary | ICD-10-CM

## 2014-04-30 DIAGNOSIS — E782 Mixed hyperlipidemia: Secondary | ICD-10-CM

## 2014-04-30 LAB — CBC WITH DIFFERENTIAL/PLATELET
BASOS PCT: 0 % (ref 0–1)
Basophils Absolute: 0 10*3/uL (ref 0.0–0.1)
EOS ABS: 0.1 10*3/uL (ref 0.0–0.7)
Eosinophils Relative: 1 % (ref 0–5)
HCT: 39 % (ref 36.0–46.0)
Hemoglobin: 13.5 g/dL (ref 12.0–15.0)
Lymphocytes Relative: 31 % (ref 12–46)
Lymphs Abs: 1.9 10*3/uL (ref 0.7–4.0)
MCH: 30.3 pg (ref 26.0–34.0)
MCHC: 34.6 g/dL (ref 30.0–36.0)
MCV: 87.4 fL (ref 78.0–100.0)
Monocytes Absolute: 0.3 10*3/uL (ref 0.1–1.0)
Monocytes Relative: 5 % (ref 3–12)
NEUTROS PCT: 63 % (ref 43–77)
Neutro Abs: 3.9 10*3/uL (ref 1.7–7.7)
PLATELETS: 231 10*3/uL (ref 150–400)
RBC: 4.46 MIL/uL (ref 3.87–5.11)
RDW: 14.2 % (ref 11.5–15.5)
WBC: 6.2 10*3/uL (ref 4.0–10.5)

## 2014-04-30 LAB — BASIC METABOLIC PANEL WITH GFR
BUN: 12 mg/dL (ref 6–23)
CALCIUM: 9.9 mg/dL (ref 8.4–10.5)
CO2: 26 mEq/L (ref 19–32)
Chloride: 106 mEq/L (ref 96–112)
Creat: 0.62 mg/dL (ref 0.50–1.10)
GFR, Est Non African American: >89 mL/min
GLUCOSE: 79 mg/dL (ref 70–99)
Potassium: 3.9 mEq/L (ref 3.5–5.3)
SODIUM: 140 meq/L (ref 135–145)

## 2014-04-30 LAB — LIPID PANEL
CHOL/HDL RATIO: 4 ratio
Cholesterol: 195 mg/dL (ref 0–200)
HDL: 49 mg/dL (ref >39)
LDL Cholesterol: 120 mg/dL — ABNORMAL HIGH (ref 0–99)
Triglycerides: 132 mg/dL (ref <150)
VLDL: 26 mg/dL (ref 0–40)

## 2014-04-30 LAB — HEPATIC FUNCTION PANEL
ALBUMIN: 5 g/dL (ref 3.5–5.2)
ALK PHOS: 89 U/L (ref 39–117)
ALT: 26 U/L (ref 0–35)
AST: 19 U/L (ref 0–37)
BILIRUBIN TOTAL: 0.5 mg/dL (ref 0.2–1.2)
Bilirubin, Direct: 0.1 mg/dL (ref 0.0–0.3)
Indirect Bilirubin: 0.4 mg/dL (ref 0.2–1.2)
Total Protein: 6.9 g/dL (ref 6.0–8.3)

## 2014-04-30 MED ORDER — SUMATRIPTAN SUCCINATE 6 MG/0.5ML ~~LOC~~ SOTJ: SUBCUTANEOUS | Status: DC

## 2014-04-30 NOTE — Progress Notes (Deleted)
Subjective:    Patient ID: Kelly Mclaughlin, female    DOB: Feb 12, 1966, 48 y.o.   MRN: [id number]  HPI Comments: 48 yo pleasant WF for cholesterol f/u. She is eating better, exercising more and losing weight. She has been off Crestor x 6 months. Last labs T 151 TG 16 H 54 L 76 ON 5 MG OF CRESTOR QOD  She had appointment with Ortho and had fluid drawn off right knee and injection and has improvement with pain since OV.   She notes migraines have been stable with Topamax and rarely needs the rescue medications.  ALT       41   09/18/2013 AST       27   09/18/2013      Medication List       This list is accurate as of: 04/30/14  9:35 AM.  Always use your most recent med list.               butalbital-acetaminophen-caffeine 50-325-40 MG per tablet  Commonly known as:  FIORICET, ESGIC  Take 1 tablet by mouth every 4 (four) hours as needed.     CALCIUM 600 + D PO  Take by mouth 2 (two) times daily.     ciclesonide 50 MCG/ACT nasal spray  Commonly known as:  OMNARIS  Place 2 sprays into both nostrils daily as needed.     estradiol 1 MG tablet  Commonly known as:  ESTRACE  Take 1 tablet by mouth daily.     fexofenadine 180 MG tablet  Commonly known as:  ALLEGRA  Take 180 mg by mouth daily as needed.     fish oil-omega-3 fatty acids 1000 MG capsule  Take 1 g by mouth daily.     fluticasone 27.5 MCG/SPRAY nasal spray  Commonly known as:  VERAMYST  Place into the nose daily.     nystatin powder  Commonly known as:  MYCOSTATIN  Apply topically as needed.     PROBIOTIC PO  Take 2 tablets by mouth daily.     SUMAtriptan Succinate 6 MG/0.5ML Sotj  Commonly known as:  SUMAVEL DOSEPRO  Use AD. May repeat 1 dose after 1 hour, max 12 mg/ 24 hours     topiramate 50 MG tablet  Commonly known as:  TOPAMAX  TAKE 1 TABLET BY MOUTH TWICE A DAY     ZYRTEC ALLERGY 10 MG tablet  Generic drug:  cetirizine  Take 10 mg by mouth daily.         Review of Systems  All other  systems reviewed and are negative.  BP 112/78  Pulse 84  Temp(Src) 97.9 F (36.6 C)  Resp 16  Ht 5' 3.5" (1.613 m)  Wt 160 lb (72.576 kg)  BMI 27.89 kg/m2     Objective:   Physical Exam  Nursing note and vitals reviewed. Constitutional: She is oriented to person, place, and time. She appears well-developed and well-nourished.  HENT:  Head: Normocephalic and atraumatic.  Right Ear: External ear normal.  Left Ear: External ear normal.  Nose: Nose normal.  Eyes: Conjunctivae and EOM are normal.  Neck: Normal range of motion.  Cardiovascular: Normal rate, regular rhythm, normal heart sounds and intact distal pulses.   Pulmonary/Chest: Effort normal and breath sounds normal.  Musculoskeletal: Normal range of motion.  Lymphadenopathy:    She has no cervical adenopathy.  Neurological: She is alert and oriented to person, place, and time.  Skin: Skin is warm and dry.  Psychiatric:  She has a normal mood and affect. Judgment normal.          Assessment & Plan:  1. Cholesterol- recheck labs, Need to eat healthier and exercise AD.  2. Migraines- Stable continue same  3. Knee pain- exercises given, f/u ortho if increased pain

## 2014-04-30 NOTE — Patient Instructions (Signed)
Knee Exercises EXERCISES RANGE OF MOTION(ROM) AND STRETCHING EXERCISES These exercises may help you when beginning to rehabilitate your injury. Your symptoms may resolve with or without further involvement from your physician, physical therapist or athletic trainer. While completing these exercises, remember:   Restoring tissue flexibility helps normal motion to return to the joints. This allows healthier, less painful movement and activity.  An effective stretch should be held for at least 30 seconds.  A stretch should never be painful. You should only feel a gentle lengthening or release in the stretched tissue. STRETCH - Knee Extension, Prone  Lie on your stomach on a firm surface, such as a bed or countertop. Place your right / left knee and leg just beyond the edge of the surface. You may wish to place a towel under the far end of your right / left thigh for comfort.  Relax your leg muscles and allow gravity to straighten your knee. Your clinician may advise you to add an ankle weight if more resistance is helpful for you.  You should feel a stretch in the back of your right / left knee. Hold this position for __________ seconds. Repeat __________ times. Complete this stretch __________ times per day. * Your physician, physical therapist or athletic trainer may ask you to add ankle weight to enhance your stretch.  RANGE OF MOTION - Knee Flexion, Active  Lie on your back with both knees straight. (If this causes back discomfort, bend your opposite knee, placing your foot flat on the floor.)  Slowly slide your heel back toward your buttocks until you feel a gentle stretch in the front of your knee or thigh.  Hold for __________ seconds. Slowly slide your heel back to the starting position. Repeat __________ times. Complete this exercise __________ times per day.  STRETCH - Quadriceps, Prone   Lie on your stomach on a firm surface, such as a bed or padded floor.  Bend your right /  left knee and grasp your ankle. If you are unable to reach, your ankle or pant leg, use a belt around your foot to lengthen your reach.  Gently pull your heel toward your buttocks. Your knee should not slide out to the side. You should feel a stretch in the front of your thigh and/or knee.  Hold this position for __________ seconds. Repeat __________ times. Complete this stretch __________ times per day.  STRETCH - Hamstrings, Supine   Lie on your back. Loop a belt or towel over the ball of your right / left foot.  Straighten your right / left knee and slowly pull on the belt to raise your leg. Do not allow the right / left knee to bend. Keep your opposite leg flat on the floor.  Raise the leg until you feel a gentle stretch behind your right / left knee or thigh. Hold this position for __________ seconds. Repeat __________ times. Complete this stretch __________ times per day.  STRENGTHENING EXERCISES These exercises may help you when beginning to rehabilitate your injury. They may resolve your symptoms with or without further involvement from your physician, physical therapist or athletic trainer. While completing these exercises, remember:   Muscles can gain both the endurance and the strength needed for everyday activities through controlled exercises.  Complete these exercises as instructed by your physician, physical therapist or athletic trainer. Progress the resistance and repetitions only as guided.  You may experience muscle soreness or fatigue, but the pain or discomfort you are trying to eliminate should  never worsen during these exercises. If this pain does worsen, stop and make certain you are following the directions exactly. If the pain is still present after adjustments, discontinue the exercise until you can discuss the trouble with your clinician. STRENGTH - Quadriceps, Isometrics  Lie on your back with your right / left leg extended and your opposite knee  bent.  Gradually tense the muscles in the front of your right / left thigh. You should see either your knee cap slide up toward your hip or increased dimpling just above the knee. This motion will push the back of the knee down toward the floor/mat/bed on which you are lying.  Hold the muscle as tight as you can without increasing your pain for __________ seconds.  Relax the muscles slowly and completely in between each repetition. Repeat __________ times. Complete this exercise __________ times per day.  STRENGTH - Quadriceps, Short Arcs   Lie on your back. Place a __________ inch towel roll under your knee so that the knee slightly bends.  Raise only your lower leg by tightening the muscles in the front of your thigh. Do not allow your thigh to rise.  Hold this position for __________ seconds. Repeat __________ times. Complete this exercise __________ times per day.  OPTIONAL ANKLE WEIGHTS: Begin with ____________________, but DO NOT exceed ____________________. Increase in 1 pound/0.5 kilogram increments.  STRENGTH - Quadriceps, Straight Leg Raises  Quality counts! Watch for signs that the quadriceps muscle is working to insure you are strengthening the correct muscles and not "cheating" by substituting with healthier muscles.  Lay on your back with your right / left leg extended and your opposite knee bent.  Tense the muscles in the front of your right / left thigh. You should see either your knee cap slide up or increased dimpling just above the knee. Your thigh may even quiver.  Tighten these muscles even more and raise your leg 4 to 6 inches off the floor. Hold for __________ seconds.  Keeping these muscles tense, lower your leg.  Relax the muscles slowly and completely in between each repetition. Repeat __________ times. Complete this exercise __________ times per day.  STRENGTH - Hamstring, Curls  Lay on your stomach with your legs extended. (If you lay on a bed, your feet  may hang over the edge.)  Tighten the muscles in the back of your thigh to bend your right / left knee up to 90 degrees. Keep your hips flat on the bed/floor.  Hold this position for __________ seconds.  Slowly lower your leg back to the starting position. Repeat __________ times. Complete this exercise __________ times per day.  OPTIONAL ANKLE WEIGHTS: Begin with ____________________, but DO NOT exceed ____________________. Increase in 1 pound/0.5 kilogram increments.  STRENGTH - Quadriceps, Squats  Stand in a door frame so that your feet and knees are in line with the frame.  Use your hands for balance, not support, on the frame.  Slowly lower your weight, bending at the hips and knees. Keep your lower legs upright so that they are parallel with the door frame. Squat only within the range that does not increase your knee pain. Never let your hips drop below your knees.  Slowly return upright, pushing with your legs, not pulling with your hands. Repeat __________ times. Complete this exercise __________ times per day.  STRENGTH - Quadriceps, Wall Slides  Follow guidelines for form closely. Increased knee pain often results from poorly placed feet or knees.  Lean against  a smooth wall or door and walk your feet out 18-24 inches. Place your feet hip-width apart.  Slowly slide down the wall or door until your knees bend __________ degrees.* Keep your knees over your heels, not your toes, and in line with your hips, not falling to either side.  Hold for __________ seconds. Stand up to rest for __________ seconds in between each repetition. Repeat __________ times. Complete this exercise __________ times per day. * Your physician, physical therapist or athletic trainer will alter this angle based on your symptoms and progress. Document Released: 09/01/2005 Document Revised: 01/10/2012 Document Reviewed: 01/30/2009 [facility] Patient Information 2015 [place], [place]. This information is not  intended to replace advice given to you by your health care provider. Make sure you discuss any questions you have with your health care provider.

## 2014-04-30 NOTE — Progress Notes (Signed)
Subjective:    Patient ID: Kelly Mclaughlin, female    DOB: [birth year], 48 y.o.   MRN: [id number]  HPI Comments: 48 yo pleasant WF for cholesterol f/u. She is eating better, exercising more and losing weight. She has been off Crestor x 6 months. Last labs T 151 TG 16 H 54 L 76 ON 5 MG OF CRESTOR QOD  She had appointment with Ortho and had fluid drawn off right knee and injection and has improvement with pain since OV.   She notes migraines have been stable with Topamax and rarely needs the rescue medications.  ALT       41   09/18/2013 AST       27   09/18/2013      Medication List       This list is accurate as of: 04/30/14 10:09 AM.  Always use your most recent med list.               butalbital-acetaminophen-caffeine 50-325-40 MG per tablet  Commonly known as:  FIORICET, ESGIC  Take 1 tablet by mouth every 4 (four) hours as needed.     CALCIUM 600 + D PO  Take by mouth 2 (two) times daily.     ciclesonide 50 MCG/ACT nasal spray  Commonly known as:  OMNARIS  Place 2 sprays into both nostrils daily as needed.     estradiol 1 MG tablet  Commonly known as:  ESTRACE  Take 1 tablet by mouth daily.     fexofenadine 180 MG tablet  Commonly known as:  ALLEGRA  Take 180 mg by mouth daily as needed.     fish oil-omega-3 fatty acids 1000 MG capsule  Take 1 g by mouth daily.     fluticasone 27.5 MCG/SPRAY nasal spray  Commonly known as:  VERAMYST  Place into the nose daily.     nystatin powder  Commonly known as:  MYCOSTATIN  Apply topically as needed.     PROBIOTIC PO  Take 2 tablets by mouth daily.     SUMAtriptan Succinate 6 MG/0.5ML Sotj  Commonly known as:  SUMAVEL DOSEPRO  Use AD. May repeat 1 dose after 1 hour, max 12 mg/ 24 hours     topiramate 50 MG tablet  Commonly known as:  TOPAMAX  TAKE 1 TABLET BY MOUTH TWICE A DAY     ZYRTEC ALLERGY 10 MG tablet  Generic drug:  cetirizine  Take 10 mg by mouth daily.       Allergies  Allergen Reactions  .  Codeine   . Imitrex [Sumatriptan Base]   . Oxycodone-Acetaminophen     hallucinate    Past Medical History  Diagnosis Date  . IBS (irritable bowel syndrome)   . GERD (gastroesophageal reflux disease)   . Migraine   . HLD (hyperlipidemia)       Review of Systems  All other systems reviewed and are negative.  BP 112/78  Pulse 84  Temp(Src) 97.9 F (36.6 C)  Resp 16  Ht 5' 3.5" (1.613 m)  Wt 160 lb (72.576 kg)  BMI 27.89 kg/m2     Objective:   Physical Exam  Nursing note and vitals reviewed. Constitutional: She is oriented to person, place, and time. She appears well-developed and well-nourished.  HENT:  Head: Normocephalic and atraumatic.  Right Ear: External ear normal.  Left Ear: External ear normal.  Nose: Nose normal.  Eyes: Conjunctivae and EOM are normal.  Neck: Normal range of motion.  Cardiovascular: Normal  rate, regular rhythm, normal heart sounds and intact distal pulses.   Pulmonary/Chest: Effort normal and breath sounds normal.  Musculoskeletal: Normal range of motion.  Lymphadenopathy:    She has no cervical adenopathy.  Neurological: She is alert and oriented to person, place, and time.  Skin: Skin is warm and dry.  Psychiatric: She has a normal mood and affect. Judgment normal.          Assessment & Plan:  1. Cholesterol- recheck labs, Need to eat healthier and exercise AD.  2. Migraines- Continue same  3. Knee pain- exercises given ,if increase symptoms f/u Ortho

## 2014-08-16 ENCOUNTER — Other Ambulatory Visit: Payer: Self-pay | Admitting: Emergency Medicine

## 2014-08-16 ENCOUNTER — Other Ambulatory Visit: Payer: Self-pay

## 2014-08-16 MED ORDER — BUTALBITAL-APAP-CAFFEINE 50-325-40 MG PO TABS: 1.0000 | ORAL_TABLET | Freq: Four times a day (QID) | ORAL | Status: DC | PRN

## 2014-08-20 ENCOUNTER — Other Ambulatory Visit: Payer: Self-pay | Admitting: Emergency Medicine

## 2014-08-20 ENCOUNTER — Other Ambulatory Visit: Payer: Self-pay | Admitting: *Deleted

## 2014-08-20 MED ORDER — SUMATRIPTAN SUCCINATE 6 MG/0.5ML ~~LOC~~ SOTJ: SUBCUTANEOUS | Status: DC

## 2014-08-20 MED ORDER — SUMATRIPTAN SUCCINATE 6 MG/0.5ML ~~LOC~~ SOLN
6.0000 mg | SUBCUTANEOUS | Status: DC | PRN
Start: 2014-08-20 — End: 2014-08-20

## 2014-08-20 MED ORDER — SUMATRIPTAN SUCCINATE 6 MG/0.5ML ~~LOC~~ SOLN: SUBCUTANEOUS | Status: DC

## 2014-08-27 ENCOUNTER — Encounter: Payer: Self-pay | Admitting: Physician Assistant

## 2014-08-27 ENCOUNTER — Ambulatory Visit (INDEPENDENT_AMBULATORY_CARE_PROVIDER_SITE_OTHER): Payer: 59 | Admitting: Physician Assistant

## 2014-08-27 ENCOUNTER — Encounter: Payer: Self-pay | Admitting: Emergency Medicine

## 2014-08-27 VITALS — BP 122/78 | HR 72 | Temp 97.8°F | Resp 16 | Ht 63.5 in | Wt 164.0 lb

## 2014-08-27 DIAGNOSIS — Z0001 Encounter for general adult medical examination with abnormal findings: Secondary | ICD-10-CM

## 2014-08-27 DIAGNOSIS — M2669 Other specified disorders of temporomandibular joint: Secondary | ICD-10-CM

## 2014-08-27 DIAGNOSIS — Z1212 Encounter for screening for malignant neoplasm of rectum: Secondary | ICD-10-CM

## 2014-08-27 DIAGNOSIS — M8580 Other specified disorders of bone density and structure, unspecified site: Secondary | ICD-10-CM

## 2014-08-27 DIAGNOSIS — Z23 Encounter for immunization: Secondary | ICD-10-CM

## 2014-08-27 DIAGNOSIS — E782 Mixed hyperlipidemia: Secondary | ICD-10-CM

## 2014-08-27 DIAGNOSIS — R6889 Other general symptoms and signs: Secondary | ICD-10-CM

## 2014-08-27 DIAGNOSIS — Z124 Encounter for screening for malignant neoplasm of cervix: Secondary | ICD-10-CM

## 2014-08-27 LAB — CBC WITH DIFFERENTIAL/PLATELET
BASOS ABS: 0 10*3/uL (ref 0.0–0.1)
Basophils Relative: 0 % (ref 0–1)
EOS PCT: 1 % (ref 0–5)
Eosinophils Absolute: 0.1 10*3/uL (ref 0.0–0.7)
HCT: 38 % (ref 36.0–46.0)
Hemoglobin: 13.2 g/dL (ref 12.0–15.0)
LYMPHS PCT: 29 % (ref 12–46)
Lymphs Abs: 2.3 10*3/uL (ref 0.7–4.0)
MCH: 30.1 pg (ref 26.0–34.0)
MCHC: 34.7 g/dL (ref 30.0–36.0)
MCV: 86.8 fL (ref 78.0–100.0)
Monocytes Absolute: 0.4 10*3/uL (ref 0.1–1.0)
Monocytes Relative: 5 % (ref 3–12)
NEUTROS ABS: 5.2 10*3/uL (ref 1.7–7.7)
Neutrophils Relative %: 65 % (ref 43–77)
PLATELETS: 240 10*3/uL (ref 150–400)
RBC: 4.38 MIL/uL (ref 3.87–5.11)
RDW: 13.6 % (ref 11.5–15.5)
WBC: 8 10*3/uL (ref 4.0–10.5)

## 2014-08-27 LAB — HEMOGLOBIN A1C
Hgb A1c MFr Bld: 5 % (ref <5.7)
Mean Plasma Glucose: 97 mg/dL (ref <117)

## 2014-08-27 MED ORDER — CYCLOBENZAPRINE HCL 10 MG PO TABS: ORAL_TABLET | ORAL | Status: DC

## 2014-08-27 NOTE — Patient Instructions (Signed)
What is the TMJ? The temporomandibular (tem-PUH-ro-man-DIB-yoo-ler) joint, or the TMJ, connects the upper and lower jawbones. This joint allows the jaw to open wide and move back and forth when you chew, talk, or yawn.There are also several muscles that help this joint move. There can be muscle tightness and pain in the muscle that can cause several symptoms.  What causes TMJ pain? There are many causes of TMJ pain. Repeated chewing (for example, chewing gum) and clenching your teeth can cause pain in the joint. Some TMJ pain has no obvious cause. What can I do to ease the pain? There are many things you can do to help your pain get better. When you have pain:  Eat soft foods and stay away from chewy foods (for example, taffy) Try to use both sides of your mouth to chew Don't chew gum Don't open your mouth wide (for example, during yawning or singing) Don't bite your cheeks or fingernails Lower your amount of stress and worry Applying a warm, damp washcloth to the joint may help. Over-the-counter pain medicines such as ibuprofen (one brand: Advil) or acetaminophen (one brand: Tylenol) might also help. Do not use these medicines if you are allergic to them or if your doctor told you not to use them. How can I stop the pain from coming back? When your pain is better, you can do these exercises to make your muscles stronger and to keep the pain from coming back:  Resisted mouth opening: Place your thumb or two fingers under your chin and open your mouth slowly, pushing up lightly on your chin with your thumb. Hold for three to six seconds. Close your mouth slowly. Resisted mouth closing: Place your thumbs under your chin and your two index fingers on the ridge between your mouth and the bottom of your chin. Push down lightly on your chin as you close your mouth. Tongue up: Slowly open and close your mouth while keeping the tongue touching the roof of the mouth. Side-to-side jaw movement: Place an  object about one fourth of an inch thick (for example, two tongue depressors) between your front teeth. Slowly move your jaw from side to side. Increase the thickness of the object as the exercise becomes easier Forward jaw movement: Place an object about one fourth of an inch thick between your front teeth and move the bottom jaw forward so that the bottom teeth are in front of the top teeth. Increase the thickness of the object as the exercise becomes easier. These exercises should not be painful. If it hurts to do these exercises, stop doing them and talk to your family doctor.   VAGINAL DRYNESS OVERVIEW  Vaginal dryness, also known as atrophic vaginitis, is a common condition in postmenopausal women. This condition is also common in women who have had both ovaries removed at the time of hysterectomy.   Some women have uncomfortable symptoms of vaginal dryness, such as pain with sex, burning vaginal discomfort or itching, or abnormal vaginal discharge, while others have no symptoms at all.  VAGINAL DRYNESS CAUSES   Estrogen helps to keep the vagina moist and to maintain thickness of the vaginal lining. Vaginal dryness occurs when the ovaries produce a decreased amount of estrogen. This can occur at certain times in a woman's life, and may be permanent or temporary. Times when less estrogen is made include: ?At the time of menopause. ?After surgical removal of the ovaries, chemotherapy, or radiation therapy of the pelvis for cancer. ?After having a  baby, particularly in women who breastfeed. ?While using certain medications, such as danazol, medroxyprogesterone (brand names: Provera or DepoProvera), leuprolide (brand name: Lupron), or nafarelin. When these medications are stopped, estrogen production resumes.  Women who smoke cigarettes have been shown to have an increased risk of an earlier menopause transition as compared to non-smokers. Therefore, atrophic vaginitis symptoms may appear at a  younger age in this population.  VAGINAL DRYNESS TREATMENT   There are three treatment options for women with vaginal dryness:  Vaginal lubricants and moisturizers - Vaginal lubricants and moisturizers can be purchased without a prescription. These products do not contain any hormones and have virtually no side effects. - Albolene is found in the facial cleanser section at [facility], [facility], or [facility]. It is a large jar with a blue top. This is the best lubricant for women because it is hypoallergenic. -Natural lubricants, such as olive, avocado or peanut oil, are easily available products that may be used as a lubricant with sex.  -Vaginal moisturizes (eg, Replens, Moist Again, Vagisil, K-Y Silk-E, and Feminease) are formulated to allow water to be retained in the vaginal tissues. Moisturizers are applied into the vagina three times weekly to allow a continued moisturizing effect. These should not be used just before having sex, as they can be irritating.  Vaginal estrogen - Vaginal estrogen is the most effective treatment option for women with vaginal dryness. Vaginal estrogen must be prescribed by a healthcare provider. Very low doses of vaginal estrogen can be used when it is put into the vagina to treat vaginal dryness. A small amount of estrogen is absorbed into the bloodstream, but only about 100 times less than when using estrogen pills or tablets. As a result, there is a much lower risk of side effects, such as blood clots, breast cancer, and heart attack, compared with other estrogen-containing products (birth control pills, menopausal hormone therapy).   Ospemifene - Ospemifene is a prescription medication that is similar to estrogen, but is not estrogen. In the vaginal tissue, it acts similarly to estrogen. In the breast tissue, it acts as an estrogen blocker. It comes in a pill, and is prescribed for women who want to use an estrogen-like medication for vaginal dryness or painful sex  associated with vaginal dryness, but prefer not to use a vaginal medication. The medication may cause hot flashes as a side effect. This type of medication may increase the risk of blood clots or uterine cancer. Further study of ospemifene is needed to evaluate the risk of these complications. This medication has not been tested in women who have had breast cancer or are at a high risk of developing breast cancer.    Sexual activity - Vaginal estrogen improves vaginal dryness quickly, usually within a few weeks. You may continue to have sex as you treat vaginal dryness because sex itself can help to keep the vaginal tissues healthy. Vaginal intercourse may help the vaginal tissues by keeping them soft and stretchable and preventing the tissues from shrinking.  If sex continues to be painful despite treatment for vaginal dryness, talk to your healthcare provider.   We want weight loss that will last so you should lose 1-2 pounds a week.  THAT IS IT! Please pick THREE things a month to change. Once it is a habit check off the item. Then pick another three items off the list to become habits.  If you are already doing a habit on the list GREAT!  Cross that item off! o Don't  drink your calories. Ie, alcohol, soda, fruit juice, and sweet tea.  o Drink more water. Drink a glass when you feel hungry or before each meal.  o Eat breakfast - Complex carb and protein (likeDannon light and fit yogurt, oatmeal, fruit, eggs, [place] bacon). o Measure your cereal.  Eat no more than one cup a day. (ie [place]) o Eat an apple a day. o Add a vegetable a day. o Try a new vegetable a month. o Use Pam! Stop using oil or butter to cook. o Don't finish your plate or use smaller plates. o Share your dessert. o Eat sugar free Jello for dessert or frozen grapes. o Don't eat 2-3 hours before bed. o Switch to whole wheat bread, pasta, and brown rice. o Make healthier choices when you eat out. No fries! o Pick baked chicken,  NOT fried. o Don't forget to SLOW DOWN when you eat. It is not going anywhere.  o Take the stairs. o Park far away in the parking lot o [employer] (or weights) for 10 minutes while watching TV. o Walk at work for 10 minutes during break. o Walk outside 1 time a week with your friend, kids, dog, or significant other. o Start a walking group at [place] the mall as much as you can tolerate.  o Keep a food diary. o Weigh yourself daily. o Walk for 15 minutes 3 days per week. o Cook at home more often and eat out less.  If life happens and you go back to old habits, it is okay.  Just start over. You can do it!   If you experience chest pain, get short of breath, or tired during the exercise, please stop immediately and inform your doctor.     Bad carbs also include fruit juice, alcohol, and sweet tea. These are empty calories that do not signal to your brain that you are full.   Please remember the good carbs are still carbs which convert into sugar. So please measure them out no more than 1/2-1 cup of rice, oatmeal, pasta, and beans.  Veggies are however free foods! Pile them on.   I like lean protein at every meal such as chicken, [place], pork chops, cottage cheese, etc. Just do not fry these meats and please center your meal around vegetable, the meats should be a side dish.   No all fruit is created equal. Please see the list below, the fruit at the bottom is higher in sugars than the fruit at the top

## 2014-08-27 NOTE — Progress Notes (Signed)
Complete Physical  Assessment and Plan: DEXA- get if lower will consider estrogen therapy TMJ- Flexeril sent in, information given to the patient, no gum/decrease hard foods, warm wet wash clothes, decrease stress, talk with dentist about possible night guard, can do massage, and exercise.  Hot flashes/vaginal atrophy-? Lexapro/estrogen- will get DEXA and decide Overweight- long discussion about weight loss, diet, and exercise Migraine- continue meds-? If TMJ contributes Hyperlipidemia--continue medications, check lipids, decrease fatty foods, increase activity.  Health Maintenance   Discussed med's effects and SE's. Screening labs and tests as requested with regular follow-up as recommended.  HPI  48 y.o. female  presents for a complete physical.   Her blood pressure has been controlled at home, today their BP is BP: 122/78 mmHg She does workout, walks at lunch, tries to get in a walk/37mile. She denies chest pain, shortness of breath, dizziness.  She is not on cholesterol medication and denies myalgias. Her cholesterol is at goal. The cholesterol last visit was:   Lab Results  Component Value Date   CHOL 195 04/30/2014   HDL 49 04/30/2014   LDLCALC 120* 04/30/2014   TRIG 132 04/30/2014   CHOLHDL 4.0 04/30/2014   Last A1C in the office was: 5.5 Patient is on Vitamin D supplement.   She has osteopenia, last DEXA 2012, due this year.  She has migraines, normally over left eye.  Right knee recent dexa injection.  + vaginal dryness BMI is Body mass index is 28.59 kg/(m^2)., she is working on diet and exercise but is frustrated with her weight.  Wt Readings from Last 3 Encounters:  08/27/14 164 lb (74.39 kg)  04/30/14 160 lb (72.576 kg)  09/03/11 170 lb 1.9 oz (77.166 kg)    Current Medications:  Current Outpatient Prescriptions on File Prior to Visit  Medication Sig Dispense Refill  . butalbital-acetaminophen-caffeine (FIORICET, ESGIC) 50-325-40 MG per tablet Take 1 tablet by mouth  every 6 (six) hours as needed. Please do not take with [place]. Please use with caution  60 tablet  0  . Calcium Carbonate-Vitamin D (CALCIUM 600 + D PO) Take by mouth 2 (two) times daily.       . cetirizine (ZYRTEC ALLERGY) 10 MG tablet Take 10 mg by mouth daily.      . fish oil-omega-3 fatty acids 1000 MG capsule Take 1 g by mouth daily.       . Probiotic Product (PROBIOTIC PO) Take 2 tablets by mouth daily.       Marland Kitchen topiramate (TOPAMAX) 50 MG tablet TAKE 1 TABLET BY MOUTH TWICE A DAY  180 tablet  1   No current facility-administered medications on file prior to visit.   Health Maintenance:   Tetanus: 2014 Pneumovax: Flu vaccine:2014 needs Zostavax: LMP: 1950's Pap: 2012 never abnormal pap, ? If needed MGM: 04/2014 will get 3D next, category C DEXA: 08/2012 osteopenia DUE  Colonoscopy: 2012 due 2017 EGD: Last Dental Exam: Last Eye Exam: Jorja Loa Echo 2012  Patient Care Team: Unk Pinto, MD as PCP - General (Internal Medicine)  Allergies:  Allergies  Allergen Reactions  . Codeine   . Imitrex [Sumatriptan Base]   . Oxycodone-Acetaminophen     hallucinate    Medical History:  Past Medical History  Diagnosis Date  . IBS (irritable bowel syndrome)   . GERD (gastroesophageal reflux disease)   . Migraine   . HLD (hyperlipidemia)    Surgical History:  Past Surgical History  Procedure Laterality Date  . Vaginal hysterectomy    .  Oophorectomy    . Tubal ligation    . Oophorectomy      x 2   Family History:  Family History  Problem Relation Age of Onset  . Heart disease Father 60    Nonobstructive CAD   Social History:  History  Substance Use Topics  . Smoking status: Former Smoker    Quit date: 11/03/2007  . Smokeless tobacco: Not on file  . Alcohol Use: Not on file   Review of Systems:  See HPI  Physical Exam: Estimated body mass index is 28.59 kg/(m^2) as calculated from the following:   Height as of this encounter: 5' 3.5" (1.613 m).   Weight as of  this encounter: 164 lb (74.39 kg). BP 122/78  Pulse 72  Temp(Src) 97.8 F (36.6 C)  Resp 16  Ht 5' 3.5" (1.613 m)  Wt 164 lb (74.39 kg)  BMI 28.59 kg/m2 General Appearance: Well nourished, in no apparent distress.  Eyes: PERRLA, EOMs, conjunctiva no swelling or erythema, normal fundi and vessels.  Sinuses: No Frontal/maxillary tenderness  ENT/Mouth: Ext aud canals clear, normal light reflex with TMs without erythema, bulging. Good dentition. No erythema, swelling, or exudate on post pharynx. Tonsils not swollen or erythematous. Hearing normal. 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 No bruits  Respiratory: Respiratory effort normal, BS equal bilaterally without rales, rhonchi, wheezing or stridor.  Cardio: RRR without murmurs, rubs or gallops. Brisk peripheral pulses without edema.  Chest: symmetric, with normal excursions and percussion.  Breasts: Symmetric, without lumps, nipple discharge, retractions.  Abdomen: Soft, nontender, no guarding, rebound, hernias, masses, or organomegaly. .  Lymphatics: Non tender without lymphadenopathy.  Genitourinary: No visible cervix, PAP declined, no cystocele, + vaginal atrophy Musculoskeletal: Full ROM all peripheral extremities,5/5 strength, and normal gait.  Skin: Warm, dry without rashes, lesions, ecchymosis. Neuro: Cranial nerves intact, reflexes equal bilaterally. Normal muscle tone, no cerebellar symptoms. Sensation intact.  Psych: Awake and oriented X 3, normal affect, Insight and Judgment appropriate.   EKG: IRBBB Twave and depression V3-V6, II, III, no changes, normal cardio work up, no symptoms   Vicie Mutters 9:17 AM [facility]

## 2014-08-28 ENCOUNTER — Encounter: Payer: Self-pay | Admitting: Physician Assistant

## 2014-08-28 LAB — BASIC METABOLIC PANEL WITH GFR
BUN: 11 mg/dL (ref 6–23)
CHLORIDE: 105 meq/L (ref 96–112)
CO2: 26 mEq/L (ref 19–32)
CREATININE: 0.7 mg/dL (ref 0.50–1.10)
Calcium: 9.7 mg/dL (ref 8.4–10.5)
Glucose, Bld: 78 mg/dL (ref 70–99)
POTASSIUM: 4 meq/L (ref 3.5–5.3)
Sodium: 141 mEq/L (ref 135–145)

## 2014-08-28 LAB — URINALYSIS, MICROSCOPIC ONLY
BACTERIA UA: NONE SEEN
CASTS: NONE SEEN
CRYSTALS: NONE SEEN
Squamous Epithelial / LPF: NONE SEEN

## 2014-08-28 LAB — HEPATIC FUNCTION PANEL
ALBUMIN: 4.6 g/dL (ref 3.5–5.2)
ALK PHOS: 94 U/L (ref 39–117)
ALT: 24 U/L (ref 0–35)
AST: 17 U/L (ref 0–37)
BILIRUBIN TOTAL: 0.4 mg/dL (ref 0.2–1.2)
Bilirubin, Direct: 0.1 mg/dL (ref 0.0–0.3)
Indirect Bilirubin: 0.3 mg/dL (ref 0.2–1.2)
Total Protein: 6.8 g/dL (ref 6.0–8.3)

## 2014-08-28 LAB — LIPID PANEL
CHOL/HDL RATIO: 3.8 ratio
Cholesterol: 188 mg/dL (ref 0–200)
HDL: 50 mg/dL (ref >39)
LDL Cholesterol: 113 mg/dL — ABNORMAL HIGH (ref 0–99)
TRIGLYCERIDES: 127 mg/dL (ref <150)
VLDL: 25 mg/dL (ref 0–40)

## 2014-08-28 LAB — FERRITIN: FERRITIN: 61 ng/mL (ref 10–291)

## 2014-08-28 LAB — TSH: TSH: 0.699 u[IU]/mL (ref 0.350–4.500)

## 2014-08-28 LAB — MICROALBUMIN / CREATININE URINE RATIO: CREATININE, URINE: 30.3 mg/dL

## 2014-08-28 LAB — VITAMIN D 25 HYDROXY (VIT D DEFICIENCY, FRACTURES): VIT D 25 HYDROXY: 120 ng/mL — AB (ref 30–89)

## 2014-08-28 LAB — URINALYSIS, ROUTINE W REFLEX MICROSCOPIC
Bilirubin Urine: NEGATIVE
Glucose, UA: NEGATIVE mg/dL
Hgb urine dipstick: NEGATIVE
KETONES UR: NEGATIVE mg/dL
NITRITE: NEGATIVE
PH: 7.5 (ref 5.0–8.0)
Protein, ur: NEGATIVE mg/dL
UROBILINOGEN UA: 0.2 mg/dL (ref 0.0–1.0)

## 2014-08-28 LAB — MAGNESIUM: Magnesium: 2 mg/dL (ref 1.5–2.5)

## 2014-08-28 LAB — IRON AND TIBC
%SAT: 29 % (ref 20–55)
Iron: 88 ug/dL (ref 42–145)
TIBC: 301 ug/dL (ref 250–470)
UIBC: 213 ug/dL (ref 125–400)

## 2014-08-28 LAB — INSULIN, FASTING: Insulin fasting, serum: 7.8 u[IU]/mL (ref 2.0–19.6)

## 2014-08-28 LAB — VITAMIN B12: Vitamin B-12: 947 pg/mL — ABNORMAL HIGH (ref 211–911)

## 2014-09-02 NOTE — Progress Notes (Signed)
[facility] @ 1:00pm. Patient aware

## 2014-09-13 ENCOUNTER — Ambulatory Visit (HOSPITAL_COMMUNITY)
Admission: RE | Admit: 2014-09-13 | Discharge: 2014-09-13 | Disposition: A | Discharge status: Home / Self Care | Payer: 59 | Source: Ambulatory Visit | Attending: Physician Assistant | Admitting: Physician Assistant

## 2014-09-13 DIAGNOSIS — M8580 Other specified disorders of bone density and structure, unspecified site: Secondary | ICD-10-CM | POA: Diagnosis not present

## 2014-09-13 DIAGNOSIS — Z13820 Encounter for screening for osteoporosis: Secondary | ICD-10-CM | POA: Diagnosis not present

## 2014-09-18 ENCOUNTER — Other Ambulatory Visit (INDEPENDENT_AMBULATORY_CARE_PROVIDER_SITE_OTHER): Payer: 59 | Admitting: *Deleted

## 2014-09-18 DIAGNOSIS — Z1212 Encounter for screening for malignant neoplasm of rectum: Secondary | ICD-10-CM

## 2014-09-18 LAB — POC HEMOCCULT BLD/STL (HOME/3-CARD/SCREEN)
Card #2 Fecal Occult Blod, POC: NEGATIVE
Card #3 Fecal Occult Blood, POC: NEGATIVE
Fecal Occult Blood, POC: NEGATIVE

## 2014-10-22 ENCOUNTER — Other Ambulatory Visit: Payer: Self-pay | Admitting: Emergency Medicine

## 2014-11-12 ENCOUNTER — Telehealth: Payer: Self-pay | Admitting: Physician Assistant

## 2014-11-12 MED ORDER — ESTRADIOL 1 MG PO TABS: ORAL_TABLET | ORAL | Status: DC

## 2014-11-12 NOTE — Telephone Encounter (Signed)
+   post menopausal symptoms. She has had a hysterectomy, we will start her on low dose estrogen. Risks of clotting and breast cancer were discussed and she understands. In addition, the cardiovascular,bone, and other benefits were discussed. She does not smoke. No personal history of breast CA or blood clots, she will be on 81mg  aspirin and she will get mammograms regularly.

## 2014-12-06 ENCOUNTER — Other Ambulatory Visit: Payer: Self-pay | Admitting: Physician Assistant

## 2014-12-06 MED ORDER — SUMATRIPTAN SUCCINATE 6 MG/0.5ML ~~LOC~~ SOTJ: SUBCUTANEOUS | Status: DC

## 2014-12-08 ENCOUNTER — Other Ambulatory Visit: Payer: Self-pay | Admitting: Physician Assistant

## 2014-12-08 MED ORDER — SUMATRIPTAN SUCCINATE 6 MG/0.5ML ~~LOC~~ SOTJ: SUBCUTANEOUS | Status: DC

## 2015-01-13 ENCOUNTER — Other Ambulatory Visit: Payer: Self-pay | Admitting: Physician Assistant

## 2015-01-13 MED ORDER — ESTRADIOL 1 MG PO TABS: ORAL_TABLET | ORAL | Status: DC

## 2015-03-10 ENCOUNTER — Other Ambulatory Visit: Payer: Self-pay | Admitting: Internal Medicine

## 2015-03-10 DIAGNOSIS — Z1231 Encounter for screening mammogram for malignant neoplasm of breast: Secondary | ICD-10-CM

## 2015-04-11 ENCOUNTER — Other Ambulatory Visit: Payer: Self-pay | Admitting: Internal Medicine

## 2015-04-11 ENCOUNTER — Ambulatory Visit (HOSPITAL_COMMUNITY)
Admission: RE | Admit: 2015-04-11 | Discharge: 2015-04-11 | Disposition: A | Discharge status: Home / Self Care | Payer: 59 | Source: Ambulatory Visit | Attending: Internal Medicine | Admitting: Internal Medicine

## 2015-04-11 DIAGNOSIS — Z1231 Encounter for screening mammogram for malignant neoplasm of breast: Secondary | ICD-10-CM | POA: Diagnosis not present

## 2015-04-18 ENCOUNTER — Other Ambulatory Visit: Payer: Self-pay | Admitting: Physician Assistant

## 2015-05-09 ENCOUNTER — Other Ambulatory Visit: Payer: Self-pay | Admitting: Physician Assistant

## 2015-05-10 ENCOUNTER — Other Ambulatory Visit: Payer: Self-pay | Admitting: Internal Medicine

## 2015-07-30 ENCOUNTER — Encounter: Payer: Self-pay | Admitting: Internal Medicine

## 2015-07-30 ENCOUNTER — Ambulatory Visit (INDEPENDENT_AMBULATORY_CARE_PROVIDER_SITE_OTHER): Payer: 59 | Admitting: Internal Medicine

## 2015-07-30 VITALS — BP 104/78 | HR 80 | Temp 98.2°F | Resp 18 | Ht 63.5 in | Wt 151.0 lb

## 2015-07-30 DIAGNOSIS — R509 Fever, unspecified: Secondary | ICD-10-CM | POA: Diagnosis not present

## 2015-07-30 LAB — POCT INFLUENZA A/B
INFLUENZA B, POC: NEGATIVE
Influenza A, POC: NEGATIVE

## 2015-07-30 MED ORDER — AZITHROMYCIN 250 MG PO TABS: ORAL_TABLET | ORAL | Status: DC

## 2015-07-30 MED ORDER — PREDNISONE 20 MG PO TABS: ORAL_TABLET | ORAL | Status: DC

## 2015-07-30 NOTE — Progress Notes (Signed)
Subjective:    Patient ID: Kelly Mclaughlin, female    DOB: 21-Apr-1966, 49 y.o.   MRN: [id number]  Fever  Associated symptoms include abdominal pain, congestion, headaches, muscle aches and sleepiness. Pertinent negatives include no chest pain, coughing, diarrhea, ear pain, nausea, rash, sore throat, urinary pain, vomiting or wheezing. She has tried acetaminophen for the symptoms. The treatment provided no relief.  Headache  Associated symptoms include abdominal pain, a fever and muscle aches. Pertinent negatives include no coughing, dizziness, ear pain, nausea, sore throat, vomiting or weakness.   Patient presents to the office for evaluation of body aches, fever, and headaches that has been going on for approximately 3 days.  It has gotten worse.  She has had a fever up to 101.3.  She does have some mild congestion.  She does have some green congestion when she blows her nose.  She does work in a Teacher, music.  She is around sick people daily.  She has not had flu shot yet this year.  She reports that     Review of Systems  Constitutional: Positive for fever, chills and fatigue.  HENT: Positive for congestion. Negative for ear pain and sore throat.   Respiratory: Negative for cough and wheezing.   Cardiovascular: Negative for chest pain.  Gastrointestinal: Positive for abdominal pain. Negative for nausea, vomiting and diarrhea.  Genitourinary: Negative for dysuria, urgency, frequency and hematuria.  Musculoskeletal: Positive for myalgias. Negative for arthralgias and neck stiffness.  Skin: Negative for rash.  Neurological: Positive for headaches. Negative for dizziness and weakness.       Objective:   Physical Exam  Constitutional: She is oriented to person, place, and time. She appears well-developed and well-nourished. No distress.  HENT:  Head: Normocephalic.  Nose: Mucosal edema present. Right sinus exhibits no maxillary sinus tenderness and no frontal sinus tenderness. Left  sinus exhibits no maxillary sinus tenderness and no frontal sinus tenderness.  Mouth/Throat: Uvula is midline, oropharynx is clear and moist and mucous membranes are normal. No trismus in the jaw. No oropharyngeal exudate, posterior oropharyngeal edema or posterior oropharyngeal erythema.  Eyes: Conjunctivae are normal. No scleral icterus.  Neck: Normal range of motion. Neck supple. No JVD present. No Brudzinski's sign and no Kernig's sign noted. No thyromegaly present.  Cardiovascular: Normal rate, regular rhythm, normal heart sounds and intact distal pulses.  Exam reveals no gallop and no friction rub.   No murmur heard. Pulmonary/Chest: Effort normal and breath sounds normal. No respiratory distress. She has no wheezes. She has no rales. She exhibits no tenderness.  Abdominal: Soft. Bowel sounds are normal. She exhibits no distension and no mass. There is no tenderness. There is no rigidity, no rebound, no guarding, no CVA tenderness, no tenderness at McBurney's point and negative Murphy's sign.  Musculoskeletal: Normal range of motion.  Lymphadenopathy:    She has no cervical adenopathy.  Neurological: She is alert and oriented to person, place, and time.  Skin: Skin is warm and dry. She is not diaphoretic.  Psychiatric: She has a normal mood and affect. Her behavior is normal. Judgment and thought content normal.  Nursing note and vitals reviewed.   Filed Vitals:   07/30/15 0944  BP: 104/78  Pulse: 80  Temp: 98.2 F (36.8 C)  Resp: 18          Assessment & Plan:    1. Fever, unspecified fever cause -likely viral syndrome as exam unimpressive.  Flu negative in office. -will cover for sinusitis  with zpak and will also give prednisone -drink plenty of fluids -alternate tylenol and ibuprofen prn - POCT Influenza A/B

## 2015-08-28 ENCOUNTER — Ambulatory Visit (INDEPENDENT_AMBULATORY_CARE_PROVIDER_SITE_OTHER): Payer: 59 | Admitting: Physician Assistant

## 2015-08-28 ENCOUNTER — Encounter: Payer: Self-pay | Admitting: Physician Assistant

## 2015-08-28 ENCOUNTER — Other Ambulatory Visit: Payer: Self-pay | Admitting: Physician Assistant

## 2015-08-28 VITALS — BP 110/60 | HR 73 | Temp 97.1°F | Resp 14 | Ht 63.5 in | Wt 149.2 lb

## 2015-08-28 DIAGNOSIS — Z0000 Encounter for general adult medical examination without abnormal findings: Secondary | ICD-10-CM

## 2015-08-28 DIAGNOSIS — Z1389 Encounter for screening for other disorder: Secondary | ICD-10-CM

## 2015-08-28 DIAGNOSIS — G43909 Migraine, unspecified, not intractable, without status migrainosus: Secondary | ICD-10-CM

## 2015-08-28 DIAGNOSIS — M8580 Other specified disorders of bone density and structure, unspecified site: Secondary | ICD-10-CM

## 2015-08-28 DIAGNOSIS — Z79899 Other long term (current) drug therapy: Secondary | ICD-10-CM

## 2015-08-28 DIAGNOSIS — K219 Gastro-esophageal reflux disease without esophagitis: Secondary | ICD-10-CM

## 2015-08-28 DIAGNOSIS — E782 Mixed hyperlipidemia: Secondary | ICD-10-CM

## 2015-08-28 DIAGNOSIS — E559 Vitamin D deficiency, unspecified: Secondary | ICD-10-CM

## 2015-08-28 DIAGNOSIS — D649 Anemia, unspecified: Secondary | ICD-10-CM

## 2015-08-28 LAB — HEPATIC FUNCTION PANEL
ALT: 14 U/L (ref 6–29)
AST: 14 U/L (ref 10–35)
Albumin: 4.1 g/dL (ref 3.6–5.1)
Alkaline Phosphatase: 71 U/L (ref 33–115)
BILIRUBIN DIRECT: 0.1 mg/dL (ref <=0.2)
BILIRUBIN INDIRECT: 0.3 mg/dL (ref 0.2–1.2)
BILIRUBIN TOTAL: 0.4 mg/dL (ref 0.2–1.2)
Total Protein: 6.5 g/dL (ref 6.1–8.1)

## 2015-08-28 LAB — CBC WITH DIFFERENTIAL/PLATELET
BASOS ABS: 0 10*3/uL (ref 0.0–0.1)
BASOS PCT: 0 % (ref 0–1)
EOS ABS: 0.1 10*3/uL (ref 0.0–0.7)
Eosinophils Relative: 2 % (ref 0–5)
HCT: 37.9 % (ref 36.0–46.0)
HEMOGLOBIN: 12.8 g/dL (ref 12.0–15.0)
Lymphocytes Relative: 32 % (ref 12–46)
Lymphs Abs: 1.7 10*3/uL (ref 0.7–4.0)
MCH: 30.2 pg (ref 26.0–34.0)
MCHC: 33.8 g/dL (ref 30.0–36.0)
MCV: 89.4 fL (ref 78.0–100.0)
MPV: 9.4 fL (ref 8.6–12.4)
Monocytes Absolute: 0.3 10*3/uL (ref 0.1–1.0)
Monocytes Relative: 5 % (ref 3–12)
NEUTROS ABS: 3.3 10*3/uL (ref 1.7–7.7)
NEUTROS PCT: 61 % (ref 43–77)
PLATELETS: 215 10*3/uL (ref 150–400)
RBC: 4.24 MIL/uL (ref 3.87–5.11)
RDW: 13.8 % (ref 11.5–15.5)
WBC: 5.4 10*3/uL (ref 4.0–10.5)

## 2015-08-28 LAB — BASIC METABOLIC PANEL WITH GFR
BUN: 9 mg/dL (ref 7–25)
CHLORIDE: 108 mmol/L (ref 98–110)
CO2: 23 mmol/L (ref 20–31)
CREATININE: 0.62 mg/dL (ref 0.50–1.10)
Calcium: 8.7 mg/dL (ref 8.6–10.2)
GFR, Est African American: >89 mL/min (ref >=60)
Glucose, Bld: 86 mg/dL (ref 65–99)
Potassium: 3.8 mmol/L (ref 3.5–5.3)
SODIUM: 141 mmol/L (ref 135–146)

## 2015-08-28 LAB — VITAMIN B12: VITAMIN B 12: 919 pg/mL — AB (ref 211–911)

## 2015-08-28 LAB — LIPID PANEL
CHOL/HDL RATIO: 3.4 ratio (ref <=5.0)
CHOLESTEROL: 176 mg/dL (ref 125–200)
HDL: 52 mg/dL (ref >=46)
LDL Cholesterol: 98 mg/dL (ref <130)
Triglycerides: 128 mg/dL (ref <150)
VLDL: 26 mg/dL (ref <30)

## 2015-08-28 LAB — IRON AND TIBC
%SAT: 21 % (ref 11–50)
Iron: 71 ug/dL (ref 40–190)
TIBC: 331 ug/dL (ref 250–450)
UIBC: 260 ug/dL (ref 125–400)

## 2015-08-28 LAB — FERRITIN: Ferritin: 43 ng/mL (ref 10–291)

## 2015-08-28 LAB — MAGNESIUM: Magnesium: 2.2 mg/dL (ref 1.5–2.5)

## 2015-08-28 LAB — TSH: TSH: 0.959 u[IU]/mL (ref 0.350–4.500)

## 2015-08-28 MED ORDER — ASPIRIN 81 MG PO TABS: 81.0000 mg | ORAL_TABLET | Freq: Every day | ORAL | Status: DC

## 2015-08-28 MED ORDER — TOPIRAMATE 50 MG PO TABS: 50.0000 mg | ORAL_TABLET | Freq: Two times a day (BID) | ORAL | Status: DC

## 2015-08-28 MED ORDER — ESTRADIOL 1 MG PO TABS: ORAL_TABLET | ORAL | Status: DC

## 2015-08-28 NOTE — Progress Notes (Signed)
Complete Physical  Assessment and Plan: 1. Mixed hyperlipidemia -continue medications, check lipids, decrease fatty foods, increase activity.  - CBC with Differential/Platelet - BASIC METABOLIC PANEL WITH GFR - Hepatic function panel - TSH - Lipid panel  2. Osteopenia On estrogen, due next year for DEXA, continue vitamin D   3. Gastroesophageal reflux disease, esophagitis presence not specified Continue PPI/H2 blocker, diet discussed  4. Migraine without status migrainosus, not intractable, unspecified migraine type Continue medicatoins  5. Routine general medical examination at a health care facility Due colonoscopy next year  6. Medication management - CBC with Differential/Platelet - BASIC METABOLIC PANEL WITH GFR - Hepatic function panel - Magnesium  7. Vitamin D deficiency - Vit D  25 hydroxy (rtn osteoporosis monitoring)  8. Screening for blood or protein in urine - Urinalysis, Routine w reflex microscopic (not at [facility]) - Microalbumin / creatinine urine ratio  9. Anemia, unspecified anemia type - Iron and TIBC - Ferritin - Vitamin B12  Health Maintenance   Discussed med's effects and SE's. Screening labs and tests as requested with regular follow-up as recommended.  HPI  49 y.o. female  presents for a complete physical.   Her blood pressure has been controlled at home, today their BP is BP: 110/60 mmHg She does workout, walks at lunch. She denies chest pain, shortness of breath, dizziness.  She is not on cholesterol medication and denies myalgias. Her cholesterol is at goal. The cholesterol last visit was:   Lab Results  Component Value Date   CHOL 188 08/27/2014   HDL 50 08/27/2014   LDLCALC 113* 08/27/2014   TRIG 127 08/27/2014   CHOLHDL 3.8 08/27/2014   Last A1C in the office was:  Lab Results  Component Value Date   HGBA1C 5.0 08/27/2014   Patient is on Vitamin D supplement, on 5000 IU every day, but has stopped to what is in calcium.  Lab  Results  Component Value Date   VD25OH 120* 08/27/2014   She has osteopenia, last DEXA 2015, due this year, she is on estrogen 1 mg daily and she is on bASA with it that is helping with hot flashes and vaginal dryness.   She has migraines, normally over left eye, have been controlled, on topamax.  BMI is Body mass index is 26.01 kg/(m^2)., she is working on diet and exercise but is frustrated with her weight.  Wt Readings from Last 3 Encounters:  08/28/15 149 lb 3.2 oz (67.677 kg)  07/30/15 151 lb (68.493 kg)  08/27/14 164 lb (74.39 kg)    Current Medications:  Current Outpatient Prescriptions on File Prior to Visit  Medication Sig Dispense Refill  . Ascorbic Acid (VITAMIN C PO) Take by mouth daily.    Marland Kitchen aspirin 81 MG tablet Take 81 mg by mouth daily.    . Calcium Carbonate-Vitamin D (CALCIUM 600 + D PO) Take by mouth 2 (two) times daily.     . cetirizine (ZYRTEC ALLERGY) 10 MG tablet Take 10 mg by mouth daily.    . Cholecalciferol (VITAMIN D PO) Take 5,000 Units by mouth daily.     . cyclobenzaprine (FLEXERIL) 10 MG tablet 1-2 at night for jaw pain/headache (Patient not taking: Reported on 07/30/2015) 60 tablet 0  . estradiol (ESTRACE) 1 MG tablet TAKE 1 TABLET DAILY FOR HOT FLASHES, GET ON LOW DOSE ASPIRIN 90 tablet 1  . fish oil-omega-3 fatty acids 1000 MG capsule Take 1 g by mouth daily.     Marland Kitchen MAGNESIUM PO Take 250  mg by mouth daily.     . Probiotic Product (PROBIOTIC PO) Take 2 tablets by mouth daily.     . Psyllium (METAMUCIL PO) Take by mouth daily.    . SUMAtriptan Succinate (SUMAVEL DOSEPRO) 6 MG/0.5ML SOTJ Inject Healy PRN for migraines 6 Device 3  . topiramate (TOPAMAX) 50 MG tablet TAKE 1 TABLET BY MOUTH TWICE A DAY 180 tablet 1   No current facility-administered medications on file prior to visit.   Immunization History  Administered Date(s) Administered  . Influenza Split 08/27/2014    Health Maintenance:   Tetanus: 2014 Pneumovax: N/A Prevnar 13:  N/A Flu vaccine:  at [facility] Zostavax: N/A LMP: 1950's Pap: TAH 3D MGM: 04/2015 ,  category C DEXA: 09/2014 osteopenia Colonoscopy: 2012 due 2017 EGD: N/A Last Dental Exam: None Last Eye Exam: Dr. Jorja Loa, glasses, this year Echo 2012 Ct head 2006  Patient Care Team: Unk Pinto, MD as PCP - General (Internal Medicine) Jari Pigg, MD as Consulting Physician (Dermatology) Minus Breeding, MD as Consulting Physician (Cardiology) Sable Feil, MD as Consulting Physician (Gastroenterology)  Allergies:  Allergies  Allergen Reactions  . Codeine   . Imitrex [Sumatriptan Base]   . Oxycodone-Acetaminophen     hallucinate    Medical History:  Past Medical History  Diagnosis Date  . IBS (irritable bowel syndrome)   . GERD (gastroesophageal reflux disease)   . Migraine   . HLD (hyperlipidemia)    Surgical History:  Past Surgical History  Procedure Laterality Date  . Vaginal hysterectomy    . Oophorectomy    . Tubal ligation    . Oophorectomy      x 2   Family History:  Family History  Problem Relation Age of Onset  . Heart disease Father 20    Nonobstructive CAD   Social History:  Social History  Substance Use Topics  . Smoking status: Former Smoker    Quit date: 11/02/2006  . Smokeless tobacco: None  . Alcohol Use: None   Review of Systems  Constitutional: Negative.   HENT: Negative.   Eyes: Negative.   Respiratory: Negative.   Cardiovascular: Negative.   Gastrointestinal: Negative.   Genitourinary: Negative.   Musculoskeletal: Negative.   Skin: Negative.   Neurological: Negative.   Endo/Heme/Allergies: Negative.   Psychiatric/Behavioral: Negative.      Physical Exam: Estimated body mass index is 26.01 kg/(m^2) as calculated from the following:   Height as of this encounter: 5' 3.5" (1.613 m).   Weight as of this encounter: 149 lb 3.2 oz (67.677 kg). BP 110/60 mmHg  Pulse 73  Temp(Src) 97.1 F (36.2 C) (Temporal)  Resp 14  Ht 5' 3.5" (1.613 m)  Wt 149 lb  3.2 oz (67.677 kg)  BMI 26.01 kg/m2  SpO2 96% General Appearance: Well nourished, in no apparent distress.  Eyes: PERRLA, EOMs, conjunctiva no swelling or erythema, normal fundi and vessels.  Sinuses: No Frontal/maxillary tenderness  ENT/Mouth: Ext aud canals clear, normal light reflex with TMs without erythema, bulging. Good dentition. No erythema, swelling, or exudate on post pharynx. Tonsils not swollen or erythematous. Hearing normal. 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 No bruits  Respiratory: Respiratory effort normal, BS equal bilaterally without rales, rhonchi, wheezing or stridor.  Cardio: RRR without murmurs, rubs or gallops. Brisk peripheral pulses without edema.  Chest: symmetric, with normal excursions and percussion.  Breasts: Symmetric, without lumps, nipple discharge, retractions.  Abdomen: Soft, nontender, no guarding, rebound, hernias, masses, or organomegaly. .  Lymphatics: Non  tender without lymphadenopathy.  Genitourinary: declines Musculoskeletal: Full ROM all peripheral extremities,5/5 strength, and normal gait.  Skin: Warm, dry without rashes, lesions, ecchymosis. Neuro: Cranial nerves intact, reflexes equal bilaterally. Normal muscle tone, no cerebellar symptoms. Sensation intact.  Psych: Awake and oriented X 3, normal affect, Insight and Judgment appropriate.   EKG: IRBBB Twave and depression V3-V6, II, III, no changes, normal cardio work up, no symptoms, will defer until next year   Vicie Mutters 9:12 AM [facility]

## 2015-08-28 NOTE — Patient Instructions (Signed)

## 2015-08-29 LAB — URINALYSIS, ROUTINE W REFLEX MICROSCOPIC
Bilirubin Urine: NEGATIVE
Glucose, UA: NEGATIVE
HGB URINE DIPSTICK: NEGATIVE
KETONES UR: NEGATIVE
LEUKOCYTES UA: NEGATIVE
NITRITE: NEGATIVE
PROTEIN: NEGATIVE
Specific Gravity, Urine: 1.011 (ref 1.001–1.035)
pH: 8 (ref 5.0–8.0)

## 2015-08-29 LAB — VITAMIN D 25 HYDROXY (VIT D DEFICIENCY, FRACTURES): VIT D 25 HYDROXY: 100 ng/mL (ref 30–100)

## 2015-08-29 LAB — MICROALBUMIN / CREATININE URINE RATIO
Creatinine, Urine: 75 mg/dL (ref 20–320)
MICROALB UR: 0.2 mg/dL
MICROALB/CREAT RATIO: 3 ug/mg{creat} (ref <30)

## 2015-10-17 ENCOUNTER — Ambulatory Visit (INDEPENDENT_AMBULATORY_CARE_PROVIDER_SITE_OTHER): Payer: 59 | Admitting: Obstetrics and Gynecology

## 2015-10-17 ENCOUNTER — Encounter: Payer: Self-pay | Admitting: Obstetrics and Gynecology

## 2015-10-17 VITALS — BP 130/80 | HR 58 | Ht 63.75 in | Wt 153.2 lb

## 2015-10-17 DIAGNOSIS — N8189 Other female genital prolapse: Secondary | ICD-10-CM

## 2015-10-17 DIAGNOSIS — N3946 Mixed incontinence: Secondary | ICD-10-CM

## 2015-10-17 DIAGNOSIS — N8110 Cystocele, unspecified: Secondary | ICD-10-CM | POA: Diagnosis not present

## 2015-10-17 NOTE — Progress Notes (Signed)
[facility] Visit  Patient name: Kelly Mclaughlin MRN [id number]  Date of birth: 1966-06-24  CC & HPI:  Kelly Mclaughlin is a 49 y.o. female presenting today for possible prolapsed bladder. Pt states that "it feels like [her] bladder dropped ... Something is not right." Pt also complains of urinary incontinence reporting that laughing, sneezing, coughing causes involuntary urination. Pt denies any problems with bowel movements.    ROS:  10 Systems reviewed and all are negative for acute change except as noted in the HPI. Pertinent History Reviewed:   Reviewed: Significant for vaginal hysterectomy, oophorectomy, tubal ligation Medical         Past Medical History  Diagnosis Date  . IBS (irritable bowel syndrome)   . GERD (gastroesophageal reflux disease)   . Migraine   . Cancer [facility])     skin                              Surgical Hx:    Past Surgical History  Procedure Laterality Date  . Vaginal hysterectomy    . Oophorectomy    . Tubal ligation    . Oophorectomy      x 2  . Skin cancer removed     Medications: Reviewed & Updated - see associated section                       Current outpatient prescriptions:  .  Ascorbic Acid (VITAMIN C PO), Take by mouth daily., Disp: , Rfl:  .  aspirin 81 MG tablet, Take 1 tablet (81 mg total) by mouth daily., Disp: 90 tablet, Rfl: 3 .  Calcium Carbonate-Vitamin D (CALCIUM 600 + D PO), Take by mouth 2 (two) times daily. , Disp: , Rfl:  .  cetirizine (ZYRTEC ALLERGY) 10 MG tablet, Take 10 mg by mouth daily., Disp: , Rfl:  .  estradiol (ESTRACE) 1 MG tablet, TAKE 1 TABLET DAILY FOR HOT FLASHES, GET ON LOW DOSE ASPIRIN, Disp: 90 tablet, Rfl: 3 .  fish oil-omega-3 fatty acids 1000 MG capsule, Take 1 g by mouth daily. , Disp: , Rfl:  .  MAGNESIUM PO, Take 250 mg by mouth daily. , Disp: , Rfl:  .  Probiotic Product (PROBIOTIC PO), Take 2 tablets by mouth daily. , Disp: , Rfl:  .  Psyllium (METAMUCIL PO), Take by mouth daily., Disp: ,  Rfl:  .  SUMAtriptan Succinate (SUMAVEL DOSEPRO) 6 MG/0.5ML SOTJ, Inject Yonah PRN for migraines, Disp: 6 Device, Rfl: 3 .  topiramate (TOPAMAX) 50 MG tablet, Take 1 tablet (50 mg total) by mouth 2 (two) times daily. (Patient taking differently: Take 50 mg by mouth. Takes 2 at bedtime), Disp: 180 tablet, Rfl: 3   Social History: Reviewed -  reports that she quit smoking about 8 years ago. Her smoking use included Cigarettes. She has never used smokeless tobacco.  Objective Findings:  Vitals: Blood pressure 130/80, pulse 58, height 5' 3.75" (1.619 m), weight 153 lb 3.2 oz (69.491 kg).  Physical Examination: General appearance - alert, well appearing, and in no distress, oriented to person, place, and time and normal appearing weight Mental status - alert, oriented to person, place, and time, normal mood, behavior, speech, dress, motor activity, and thought processes Pelvic - normal external genitalia, vulva, vagina, cervix, uterus and adnexa,  VULVA: normal appearing vulva with no masses, tenderness or lesions,  VAGINA: normal appearing vagina with  normal color and discharge, no lesions,  CERVIX: surgically absent,  UTERUS: surgically absent, vaginal cuff well healed,  ADNEXA: surgically absent both sides,  RECTAL: normal rectal, no masses, no rectocele Neurological - alert, oriented, normal speech, no focal findings or movement disorder noted Musculoskeletal - no joint tenderness, deformity or swelling Skin - normal coloration and turgor, no rashes, no suspicious skin lesions noted  Discussed with pt methods to manage minimal cystocele. Pt had opportunity to ask questions and has no further questions at this time. Greater than 50% was spent in counseling and coordination of care with the patient. Total time greater than: 12 minutes  Assessment & Plan:   A:  1. Minimal cystocele  2. Urinary incontinence  Slight introitus laxity s/p deliveries. P:  1. Reassured pt of adequacy of current  support 2. offered referral to urology for urinary incontinence 3. Return PRN   By signing my name below, I, Terressa Koyanagi, attest that this documentation has been prepared under the direction and in the presence of Mallory Shirk, MD. Electronically Signed: Terressa Koyanagi, ED Scribe. 10/17/2015. 9:49 AM.   I personally performed the services described in this documentation, which was SCRIBED in my presence. The recorded information has been reviewed and considered accurate. It has been edited as necessary during review. Jonnie Kind, MD

## 2015-11-24 ENCOUNTER — Other Ambulatory Visit: Payer: Self-pay | Admitting: Physician Assistant

## 2015-12-16 ENCOUNTER — Encounter: Payer: Self-pay | Admitting: Internal Medicine

## 2015-12-22 ENCOUNTER — Other Ambulatory Visit: Payer: Self-pay | Admitting: Internal Medicine

## 2015-12-24 ENCOUNTER — Encounter: Payer: Self-pay | Admitting: Internal Medicine

## 2016-01-16 ENCOUNTER — Ambulatory Visit (AMBULATORY_SURGERY_CENTER): Payer: Self-pay

## 2016-01-16 VITALS — Ht 64.0 in | Wt 158.0 lb

## 2016-01-16 DIAGNOSIS — Z86010 Personal history of colonic polyps: Secondary | ICD-10-CM

## 2016-01-16 MED ORDER — NA SULFATE-K SULFATE-MG SULF 17.5-3.13-1.6 GM/177ML PO SOLN: 1.0000 | Freq: Once | ORAL | Status: DC

## 2016-01-16 NOTE — Progress Notes (Signed)
No egg or soy allergies Not on home 02 No previous anesthesia complications No diet or weight loss meds 

## 2016-01-30 ENCOUNTER — Encounter: Payer: Self-pay | Admitting: Internal Medicine

## 2016-01-30 ENCOUNTER — Ambulatory Visit (AMBULATORY_SURGERY_CENTER): Payer: Managed Care, Other (non HMO) | Admitting: Internal Medicine

## 2016-01-30 VITALS — BP 107/65 | HR 50 | Temp 96.8°F | Resp 12 | Ht 64.0 in | Wt 158.0 lb

## 2016-01-30 DIAGNOSIS — D129 Benign neoplasm of anus and anal canal: Secondary | ICD-10-CM

## 2016-01-30 DIAGNOSIS — Z86010 Personal history of colonic polyps: Secondary | ICD-10-CM

## 2016-01-30 DIAGNOSIS — K635 Polyp of colon: Secondary | ICD-10-CM | POA: Diagnosis not present

## 2016-01-30 DIAGNOSIS — D127 Benign neoplasm of rectosigmoid junction: Secondary | ICD-10-CM | POA: Diagnosis not present

## 2016-01-30 DIAGNOSIS — D128 Benign neoplasm of rectum: Secondary | ICD-10-CM

## 2016-01-30 MED ORDER — SODIUM CHLORIDE 0.9 % IV SOLN: 500.0000 mL | INTRAVENOUS | Status: DC

## 2016-01-30 NOTE — Patient Instructions (Signed)
YOU HAD AN ENDOSCOPIC PROCEDURE TODAY AT THE [facility]:   Refer to the procedure report that was given to you for any specific questions about what was found during the examination.  If the procedure report does not answer your questions, please call your gastroenterologist to clarify.  If you requested that your care partner not be given the details of your procedure findings, then the procedure report has been included in a sealed envelope for you to review at your convenience later.  YOU SHOULD EXPECT: Some feelings of bloating in the abdomen. Passage of more gas than usual.  Walking can help get rid of the air that was put into your GI tract during the procedure and reduce the bloating. If you had a lower endoscopy (such as a colonoscopy or flexible sigmoidoscopy) you may notice spotting of blood in your stool or on the toilet paper. If you underwent a bowel prep for your procedure, you may not have a normal bowel movement for a few days.  Please Note:  You might notice some irritation and congestion in your nose or some drainage.  This is from the oxygen used during your procedure.  There is no need for concern and it should clear up in a day or so.  SYMPTOMS TO REPORT IMMEDIATELY:   Following lower endoscopy (colonoscopy or flexible sigmoidoscopy):  Excessive amounts of blood in the stool  Significant tenderness or worsening of abdominal pains  Swelling of the abdomen that is new, acute  Fever of 100F or higher    For urgent or emergent issues, a gastroenterologist can be reached at any hour by calling [phone].   DIET: Your first meal following the procedure should be a small meal and then it is ok to progress to your normal diet. Heavy or fried foods are harder to digest and may make you feel nauseous or bloated.  Likewise, meals heavy in dairy and vegetables can increase bloating.  Drink plenty of fluids but you should avoid alcoholic beverages for 24  hours.  ACTIVITY:  You should plan to take it easy for the rest of today and you should NOT DRIVE or use heavy machinery until tomorrow (because of the sedation medicines used during the test).    FOLLOW UP: Our staff will call the number listed on your records the next business day following your procedure to check on you and address any questions or concerns that you may have regarding the information given to you following your procedure. If we do not reach you, we will leave a message.  However, if you are feeling well and you are not experiencing any problems, there is no need to return our call.  We will assume that you have returned to your regular daily activities without incident.  If any biopsies were taken you will be contacted by phone or by letter within the next 1-3 weeks.  Please call us at [phone] if you have not heard about the biopsies in 3 weeks.    SIGNATURES/CONFIDENTIALITY: You and/or your care partner have signed paperwork which will be entered into your electronic medical record.  These signatures attest to the fact that that the information above on your After Visit Summary has been reviewed and is understood.  Full responsibility of the confidentiality of this discharge information lies with you and/or your care-partner.   Resume medications. Information given on polyps,diverticulosis and high fiber diet. 

## 2016-01-30 NOTE — Progress Notes (Signed)
A/ox3 pleased with MAC, report to Sheila RN 

## 2016-01-30 NOTE — Op Note (Signed)
[facility] Patient Name: Kelly Mclaughlin Procedure Date: 01/30/2016 1:25 PM MRN: [id number] Endoscopist: Jerene Bears , MD Age: 50 Referring MD:  Date of Birth: [birth year] Gender: Female Procedure:                Colonoscopy Indications:              High risk colon cancer surveillance: Personal                            history of sessile serrated colon polyp (less than                            10 mm in size) with no dysplasia, Last colonoscopy                            5 years ago Medicines:                Monitored Anesthesia Care Procedure:                Pre-Anesthesia Assessment:                           - Prior to the procedure, a History and Physical                            was performed, and patient medications and                            allergies were reviewed. The patient's tolerance of                            previous anesthesia was also reviewed. The risks                            and benefits of the procedure and the sedation                            options and risks were discussed with the patient.                            All questions were answered, and informed consent                            was obtained. Prior Anticoagulants: The patient has                            taken no previous anticoagulant or antiplatelet                            agents. ASA Grade Assessment: II - A patient with                            mild systemic disease. After reviewing the risks  and benefits, the patient was deemed in                            satisfactory condition to undergo the procedure.                           After obtaining informed consent, the colonoscope                            was passed under direct vision. Throughout the                            procedure, the patient's blood pressure, pulse, and                            oxygen saturations were monitored continuously. The      Model PCF-H190L [id number]) scope was introduced                            through the anus and advanced to the the cecum,                            identified by appendiceal orifice and ileocecal                            valve. The colonoscopy was performed without                            difficulty. The patient tolerated the procedure                            well. The quality of the bowel preparation was                            good. The ileocecal valve, appendiceal orifice, and                            rectum were photographed. Scope In: 1:47:21 PM Scope Out: 2:02:33 PM Scope Withdrawal Time: 0 hours 10 minutes 48 seconds  Total Procedure Duration: 0 hours 15 minutes 12 seconds  Findings:      The digital rectal exam was normal.      A 5 mm polyp was found in the distal sigmoid colon. The polyp was       sessile. The polyp was removed with a cold snare. Resection and       retrieval were complete.      Multiple diverticula were found in the sigmoid colon.      The exam was otherwise without abnormality on direct and retroflexion       views. Complications:            No immediate complications. Estimated Blood Loss:     Estimated blood loss: none. Impression:               - One 5 mm polyp in the distal sigmoid colon,  removed with a cold snare. Resected and retrieved.                           - Diverticulosis in the sigmoid colon.                           - The examination was otherwise normal on direct                            and retroflexion views. Recommendation:           - Patient has a contact number available for                            emergencies. The signs and symptoms of potential                            delayed complications were discussed with the                            patient. Return to normal activities tomorrow.                            Written discharge instructions were provided to the                             patient.                           - Resume previous diet.                           - Continue present medications.                           - Await pathology results.                           - Repeat colonoscopy is recommended for                            surveillance. The colonoscopy date will be                            determined after pathology results from today's                            exam become available for review. Procedure Code(s):        --- Professional ---                           [phone], Colonoscopy, flexible; with removal of                            tumor(s), polyp(s), or other lesion(s) by snare  technique CPT copyright 2016 [employer]. All rights reserved. Jerene Bears, MD 01/30/2016 2:10:06 PM This report has been signed electronically. Number of Addenda: 0 Referring MD:      Unk Pinto

## 2016-01-30 NOTE — Progress Notes (Signed)
Called to room to assist during endoscopic procedure.  Patient ID and intended procedure confirmed with present staff. Received instructions for my participation in the procedure from the performing physician.  

## 2016-02-02 ENCOUNTER — Telehealth: Payer: Self-pay | Admitting: *Deleted

## 2016-02-02 NOTE — Telephone Encounter (Signed)
  Follow up Call-  Call back number 01/30/2016  Post procedure Call Back phone  # [phone]  Permission to leave phone message Yes     Patient questions:  Do you have a fever, pain , or abdominal swelling? No. Pain Score  0 *  Have you tolerated food without any problems? Yes.    Have you been able to return to your normal activities? Yes.    Do you have any questions about your discharge instructions: Diet   No. Medications  No. Follow up visit  No.  Do you have questions or concerns about your Care? No.  Actions: * If pain score is 4 or above: No action needed, pain <4.

## 2016-02-03 ENCOUNTER — Encounter: Payer: Self-pay | Admitting: Internal Medicine

## 2016-03-12 ENCOUNTER — Other Ambulatory Visit: Payer: Self-pay

## 2016-03-12 DIAGNOSIS — Z1231 Encounter for screening mammogram for malignant neoplasm of breast: Secondary | ICD-10-CM

## 2016-03-31 ENCOUNTER — Other Ambulatory Visit: Payer: Self-pay | Admitting: Internal Medicine

## 2016-03-31 DIAGNOSIS — G441 Vascular headache, not elsewhere classified: Secondary | ICD-10-CM

## 2016-03-31 DIAGNOSIS — N951 Menopausal and female climacteric states: Secondary | ICD-10-CM

## 2016-03-31 MED ORDER — ESTRADIOL 1 MG PO TABS: ORAL_TABLET | ORAL | Status: DC

## 2016-03-31 MED ORDER — SUMATRIPTAN SUCCINATE 6 MG/0.5ML ~~LOC~~ SOAJ: SUBCUTANEOUS | Status: DC

## 2016-04-14 ENCOUNTER — Ambulatory Visit
Admission: RE | Admit: 2016-04-14 | Discharge: 2016-04-14 | Disposition: A | Discharge status: Home / Self Care | Payer: Managed Care, Other (non HMO) | Source: Ambulatory Visit

## 2016-04-14 DIAGNOSIS — Z1231 Encounter for screening mammogram for malignant neoplasm of breast: Secondary | ICD-10-CM

## 2016-04-15 ENCOUNTER — Other Ambulatory Visit: Payer: Self-pay | Admitting: Internal Medicine

## 2016-04-15 DIAGNOSIS — R928 Other abnormal and inconclusive findings on diagnostic imaging of breast: Secondary | ICD-10-CM

## 2016-04-26 ENCOUNTER — Other Ambulatory Visit: Payer: Self-pay | Admitting: Internal Medicine

## 2016-04-26 ENCOUNTER — Ambulatory Visit
Admission: RE | Admit: 2016-04-26 | Discharge: 2016-04-26 | Disposition: A | Discharge status: Home / Self Care | Payer: Managed Care, Other (non HMO) | Source: Ambulatory Visit | Attending: Internal Medicine | Admitting: Internal Medicine

## 2016-04-26 DIAGNOSIS — R928 Other abnormal and inconclusive findings on diagnostic imaging of breast: Secondary | ICD-10-CM

## 2016-04-26 DIAGNOSIS — N6489 Other specified disorders of breast: Secondary | ICD-10-CM

## 2016-08-12 ENCOUNTER — Other Ambulatory Visit: Payer: Self-pay | Admitting: Physician Assistant

## 2016-08-27 ENCOUNTER — Encounter: Payer: Self-pay | Admitting: Physician Assistant

## 2016-09-03 ENCOUNTER — Encounter: Payer: Self-pay | Admitting: Internal Medicine

## 2016-10-08 ENCOUNTER — Other Ambulatory Visit: Payer: Self-pay | Admitting: Internal Medicine

## 2016-10-08 ENCOUNTER — Ambulatory Visit (INDEPENDENT_AMBULATORY_CARE_PROVIDER_SITE_OTHER): Payer: Managed Care, Other (non HMO) | Admitting: Internal Medicine

## 2016-10-08 ENCOUNTER — Encounter: Payer: Self-pay | Admitting: Internal Medicine

## 2016-10-08 VITALS — BP 112/62 | Temp 98.0°F | Resp 16 | Ht 63.75 in | Wt 169.0 lb

## 2016-10-08 DIAGNOSIS — Z0000 Encounter for general adult medical examination without abnormal findings: Secondary | ICD-10-CM

## 2016-10-08 DIAGNOSIS — E7800 Pure hypercholesterolemia, unspecified: Secondary | ICD-10-CM

## 2016-10-08 DIAGNOSIS — Z136 Encounter for screening for cardiovascular disorders: Secondary | ICD-10-CM

## 2016-10-08 DIAGNOSIS — Z130 Encounter for screening for diseases of the blood and blood-forming organs and certain disorders involving the immune mechanism: Secondary | ICD-10-CM

## 2016-10-08 DIAGNOSIS — E559 Vitamin D deficiency, unspecified: Secondary | ICD-10-CM

## 2016-10-08 DIAGNOSIS — Z1389 Encounter for screening for other disorder: Secondary | ICD-10-CM

## 2016-10-08 DIAGNOSIS — I10 Essential (primary) hypertension: Secondary | ICD-10-CM

## 2016-10-08 DIAGNOSIS — K219 Gastro-esophageal reflux disease without esophagitis: Secondary | ICD-10-CM

## 2016-10-08 DIAGNOSIS — G43909 Migraine, unspecified, not intractable, without status migrainosus: Secondary | ICD-10-CM

## 2016-10-08 DIAGNOSIS — M7662 Achilles tendinitis, left leg: Secondary | ICD-10-CM

## 2016-10-08 DIAGNOSIS — Z79899 Other long term (current) drug therapy: Secondary | ICD-10-CM

## 2016-10-08 DIAGNOSIS — Z1329 Encounter for screening for other suspected endocrine disorder: Secondary | ICD-10-CM

## 2016-10-08 DIAGNOSIS — Z0001 Encounter for general adult medical examination with abnormal findings: Secondary | ICD-10-CM

## 2016-10-08 DIAGNOSIS — Z131 Encounter for screening for diabetes mellitus: Secondary | ICD-10-CM

## 2016-10-08 MED ORDER — MELOXICAM 15 MG PO TABS: 15.0000 mg | ORAL_TABLET | Freq: Every day | ORAL | 0 refills | Status: DC

## 2016-10-08 MED ORDER — ZEMBRACE SYMTOUCH 3 MG/0.5ML ~~LOC~~ SOAJ: 1.0000 "pen " | SUBCUTANEOUS | 3 refills | Status: DC | PRN

## 2016-10-08 NOTE — Progress Notes (Signed)
Complete Physical  Assessment and Plan:   1. Encounter for general adult medical examination with abnormal findings -due next year  2. Migraine without status migrainosus, not intractable, unspecified migraine type -given copay card will try to get sumatriptan at less expensive cost as it was cost prohibitive but works the best.   Philippa Chester SYMTOUCH 3 MG/0.5ML SOAJ; Inject 1 pen into the skin as needed (Migraine).  Dispense: 3 pen; Refill: 3  3. Gastroesophageal reflux disease, esophagitis presence not specified -not currently active  4. HYPERCHOLESTEROLEMIA -cont diet and exercise - Lipid panel  5. Screening for diabetes mellitus -cont diet and exercise - Hemoglobin A1c - Insulin, random  6. Screening for thyroid disorder  - TSH  7. Screening for deficiency anemia  - Iron and TIBC - Vitamin B12  8. Screening for hematuria or proteinuria  - Urinalysis, Routine w reflex microscopic - Microalbumin / creatinine urine ratio  9. Screening for cardiovascular condition -unchanged EKG - EKG 12-Lead  10. Medication management  - CBC with Differential/Platelet - BASIC METABOLIC PANEL WITH GFR - Hepatic function panel - Magnesium  11. Vitamin D deficiency -cont supplement - VITAMIN D 25 Hydroxy (Vit-D Deficiency, Fractures)  12. Achilles tendinitis of left lower extremity -heel cup -if no improvement will send to ortho - meloxicam (MOBIC) 15 MG tablet; Take 1 tablet (15 mg total) by mouth daily.  Dispense: 90 tablet; Refill: 0  13.  Left breast mass -know ultrasound done in June -repeat to be done in a couple weeks -if changes will need breast biopsy.     Discussed med's effects and SE's. Screening labs and tests as requested with regular follow-up as recommended.  HPI  50 y.o. female  presents for a complete physical.  Her blood pressure has been controlled at home, today their BP is BP: 112/62.  She does workout. She denies chest pain, shortness of breath,  dizziness.   She is not on cholesterol medication and denies myalgias. Her cholesterol is at goal. The cholesterol last visit was:  Lab Results  Component Value Date   CHOL 176 08/28/2015   HDL 52 08/28/2015   LDLCALC 98 08/28/2015   TRIG 128 08/28/2015   CHOLHDL 3.4 08/28/2015  . Patient is on Vitamin D supplement.   Lab Results  Component Value Date   VD25OH 100 08/28/2015     She is not currently seeing obgyn since her hysterectomy.  She does have another mammogram scheduled for 10/27/16.  She had one in June and they want to have another look at the left breast.  She has a history of two maternal aunts with breast cancer.   She does have migraines.  She has them roughly every once per month.  She has not used the imitrex in months.  She reports that she struggled to get the imitrex and she is afraid to use it.  She has tried the pills in the past but had little to no relief.  She had little response to it.      Current Medications:  Current Outpatient Prescriptions on File Prior to Visit  Medication Sig Dispense Refill  . Ascorbic Acid (VITAMIN C PO) Take 1,000 mg by mouth daily.     Marland Kitchen aspirin 81 MG tablet Take 1 tablet (81 mg total) by mouth daily. 90 tablet 3  . cetirizine (ZYRTEC ALLERGY) 10 MG tablet Take 10 mg by mouth daily.    Marland Kitchen estradiol (ESTRACE) 1 MG tablet TAKE 1 TABLET BY MOUTH EVERY DAY  FOR HOT FLASHES. TAKE WITH ASPIRIN 90 tablet 1  . fish oil-omega-3 fatty acids 1000 MG capsule Take 1 g by mouth daily.     Marland Kitchen MAGNESIUM PO Take 250 mg by mouth daily.     . Probiotic Product (PROBIOTIC PO) Take 2 tablets by mouth daily.     . Psyllium (METAMUCIL PO) Take by mouth daily.    . SUMAtriptan 6 MG/0.5ML SOAJ Inject 1 dose sub-cut and may repeat 1 x  in 2 hours Maximum 2 doses /24 hours 10 Syringe 1  . topiramate (TOPAMAX) 50 MG tablet TAKE 1 TABLET (50 MG TOTAL) BY MOUTH 2 (TWO) TIMES DAILY. 180 tablet 3   No current facility-administered medications on file prior to  visit.     Health Maintenance:   Immunization History  Administered Date(s) Administered  . Influenza Split 08/27/2014    Tetanus: LMP: Hysterectomy Pap: Hysterectomy MGM: 6/17, Scheduled 12/17 Colonoscopy: 3/17 Dexa:  2015 Last Dental Exam: Last Eye Exam:  Patient Care Team: Unk Pinto, MD as PCP - General (Internal Medicine) Jari Pigg, MD (Inactive) as Consulting Physician (Dermatology) Minus Breeding, MD as Consulting Physician (Cardiology) Sable Feil, MD as Consulting Physician (Gastroenterology)  Allergies:  Allergies  Allergen Reactions  . Oxycodone Other (See Comments)    Hallucinations  . Codeine     Medical History:  Past Medical History:  Diagnosis Date  . Allergy   . Cancer ([place])    skin  . GERD (gastroesophageal reflux disease)   . IBS (irritable bowel syndrome)   . Migraine   . Osteoporosis    osteopenia    Surgical History:  Past Surgical History:  Procedure Laterality Date  . COLONOSCOPY  11/09/2010  . OOPHORECTOMY    . OOPHORECTOMY     x 2  . POLYPECTOMY  11/09/2010  . skin cancer removed    . TUBAL LIGATION    . VAGINAL HYSTERECTOMY      Family History:  Family History  Problem Relation Age of Onset  . Heart disease Father 46    Nonobstructive CAD  . Cancer Father   . Stroke Father   . Kidney disease Father   . Arthritis Father   . Diabetes Mother   . Hypertension Mother   . Miscarriages / [place] Mother   . Colitis Brother   . Heart attack Maternal Grandmother   . Migraines Paternal Grandmother   . Other Paternal Grandmother     had a pacemaker  . Cancer Paternal Grandfather     pancreatic  . Colon cancer Neg Hx     Social History:  Social History  Substance Use Topics  . Smoking status: Former Smoker    Types: Cigarettes    Quit date: 11/02/2006  . Smokeless tobacco: Never Used  . Alcohol use No    Review of Systems: Review of Systems  Constitutional: Negative for chills, fever and  malaise/fatigue.  HENT: Negative for congestion, ear pain and sore throat.   Eyes: Negative.   Respiratory: Negative for cough, shortness of breath and wheezing.   Cardiovascular: Negative for chest pain, palpitations and leg swelling.  Gastrointestinal: Negative for abdominal pain, blood in stool, constipation, diarrhea, heartburn and melena.  Genitourinary: Negative.   Musculoskeletal: Positive for joint pain.  Skin: Negative.   Neurological: Positive for headaches. Negative for dizziness, sensory change and loss of consciousness.  Psychiatric/Behavioral: Negative for depression. The patient is not nervous/anxious and does not have insomnia.     Physical Exam: Estimated body  mass index is 29.24 kg/m as calculated from the following:   Height as of this encounter: 5' 3.75" (1.619 m).   Weight as of this encounter: 169 lb (76.7 kg). BP 112/62   Temp 98 F (36.7 C) (Temporal)   Resp 16   Ht 5' 3.75" (1.619 m)   Wt 169 lb (76.7 kg)   BMI 29.24 kg/m   General Appearance: Well nourished well developed, in no apparent distress.  Eyes: PERRLA, EOMs, conjunctiva no swelling or erythema ENT/Mouth: Ear canals normal without obstruction, swelling, erythema, or discharge.  TMs normal bilaterally with no erythema, bulging, retraction, or loss of landmark.  Oropharynx moist and clear with no exudate, erythema, or swelling.   Neck: Supple, thyroid normal. No bruits.  No cervical adenopathy Respiratory: Respiratory effort normal, Breath sounds clear A&P without wheeze, rhonchi, rales.   Cardio: RRR without murmurs, rubs or gallops. Brisk peripheral pulses without edema.  Chest: symmetric, with normal excursions Breasts: Symmetric, without lumps, nipple discharge, retractions.  Breast mass at 9 oclock at approximately 1 cm from nipple.  Firm non-tender nodule which is freely mobile beneath the skin.    Abdomen: Soft, nontender, no guarding, rebound, hernias, masses, or organomegaly.  Lymphatics:  Non tender without lymphadenopathy.   Musculoskeletal: Full ROM all peripheral extremities,5/5 strength, and normal gait.  Skin: Warm, dry without rashes, lesions, ecchymosis. Neuro: Awake and oriented X 3, Cranial nerves intact, reflexes equal bilaterally. Normal muscle tone, no cerebellar symptoms. Sensation intact.  Psych:  normal affect, Insight and Judgment appropriate.   EKG: Diffuse T wave inversion without change from last year.  Has had normal cardiology workup in the past.    Over 40 minutes of exam, counseling, chart review and critical decision making was performed  Starlyn Skeans 9:48 AM [facility]

## 2016-10-08 NOTE — Patient Instructions (Signed)
Achilles Tendinitis  Achilles tendinitis is inflammation of the tough, cord-like band that attaches the lower muscles of your leg to your heel (Achilles tendon). It is usually caused by overusing the tendon and joint involved.   CAUSES  Achilles tendinitis can happen because of:   A sudden increase in exercise or activity (such as running).   Doing the same exercises or activities (such as jumping) over and over.   Not warming up calf muscles before exercising.   Exercising in shoes that are worn out or not made for exercise.   Having arthritis or a bone growth on the back of the heel bone. This can rub against the tendon and hurt the tendon.  SIGNS AND SYMPTOMS  The most common symptoms are:   Pain in the back of the leg, just above the heel. The pain usually gets worse with exercise and better with rest.   Stiffness or soreness in the back of the leg, especially in the morning.   Swelling of the skin over the Achilles tendon.   Trouble standing on tiptoe.  Sometimes, an Achilles tendon tears (ruptures). Symptoms of an Achilles tendon rupture can include:   Sudden, severe pain in the back of the leg.   Trouble putting weight on the foot or walking normally.  DIAGNOSIS  Achilles tendinitis will be diagnosed based on symptoms and a physical examination. An X-ray may be done to check if another condition is causing your symptoms. An MRI may be ordered if your health care provider suspects you may have completely torn your tendon, which is called an Achilles tendon rupture.   TREATMENT   Achilles tendinitis usually gets better over time. It can take weeks to months to heal completely. Treatment focuses on treating the symptoms and helping the injury heal.  HOME CARE INSTRUCTIONS    Rest your Achilles tendon and avoid activities that cause pain.   Apply ice to the injured area:    Put ice in a plastic bag.    Place a towel between your skin and the bag.    Leave the ice on for 20 minutes, 2-3 times a  day   Try to avoid using the tendon (other than gentle range of motion) while the tendon is painful. Do not resume use until instructed by your health care provider. Then begin use gradually. Do not increase use to the point of pain. If pain does develop, decrease use and continue the above measures. Gradually increase activities that do not cause discomfort until you achieve normal use.   Do exercises to make your calf muscles stronger and more flexible. Your health care provider or physical therapist can recommend exercises for you to do.   Wrap your ankle with an elastic bandage or other wrap. This can help keep your tendon from moving too much. Your health care provider will show you how to wrap your ankle correctly.   Only take over-the-counter or prescription medicines for pain, discomfort, or fever as directed by your health care provider.  SEEK MEDICAL CARE IF:    Your pain and swelling increase or pain is uncontrolled with medicines.   You develop new, unexplained symptoms or your symptoms get worse.   You are unable to move your toes or foot.   You develop warmth and swelling in your foot.   You have an unexplained temperature.  MAKE SURE YOU:    Understand these instructions.   Will watch your condition.   Will get help right away   if you are not doing well or get worse.     This information is not intended to replace advice given to you by your health care provider. Make sure you discuss any questions you have with your health care provider.     Document Released: 07/28/2005 Document Revised: 11/08/2014 Document Reviewed: 05/30/2013  Elsevier Interactive Patient Education 2017 [employer]

## 2016-10-12 LAB — HEPATIC FUNCTION PANEL
ALK PHOS: 71 U/L (ref 33–130)
ALT: 12 U/L (ref 6–29)
AST: 13 U/L (ref 10–35)
Albumin: 4 g/dL (ref 3.6–5.1)
BILIRUBIN TOTAL: 0.3 mg/dL (ref 0.2–1.2)
Bilirubin, Direct: 0.1 mg/dL (ref <=0.2)
Indirect Bilirubin: 0.2 mg/dL (ref 0.2–1.2)
Total Protein: 6.4 g/dL (ref 6.1–8.1)

## 2016-10-12 LAB — BASIC METABOLIC PANEL WITH GFR
BUN: 12 mg/dL (ref 7–25)
CHLORIDE: 109 mmol/L (ref 98–110)
CO2: 23 mmol/L (ref 20–31)
CREATININE: 0.64 mg/dL (ref 0.50–1.05)
Calcium: 9.1 mg/dL (ref 8.6–10.4)
GFR, Est African American: >89 mL/min (ref >=60)
GFR, Est Non African American: >89 mL/min (ref >=60)
GLUCOSE: 80 mg/dL (ref 65–99)
POTASSIUM: 3.8 mmol/L (ref 3.5–5.3)
Sodium: 140 mmol/L (ref 135–146)

## 2016-10-12 LAB — CBC WITH DIFFERENTIAL/PLATELET
BASOS ABS: 0 {cells}/uL (ref 0–200)
Basophils Relative: 0 %
Eosinophils Absolute: 70 cells/uL (ref 15–500)
Eosinophils Relative: 1 %
HEMATOCRIT: 39.2 % (ref 35.0–45.0)
HEMOGLOBIN: 13 g/dL (ref 11.7–15.5)
LYMPHS ABS: 1820 {cells}/uL (ref 850–3900)
LYMPHS PCT: 26 %
MCH: 29.7 pg (ref 27.0–33.0)
MCHC: 33.2 g/dL (ref 32.0–36.0)
MCV: 89.5 fL (ref 80.0–100.0)
MONO ABS: 350 {cells}/uL (ref 200–950)
MPV: 10.6 fL (ref 7.5–12.5)
Monocytes Relative: 5 %
NEUTROS PCT: 68 %
Neutro Abs: 4760 cells/uL (ref 1500–7800)
Platelets: 244 10*3/uL (ref 140–400)
RBC: 4.38 MIL/uL (ref 3.80–5.10)
RDW: 12 % (ref 11.0–15.0)
WBC: 7 10*3/uL (ref 3.8–10.8)

## 2016-10-12 LAB — URINALYSIS, ROUTINE W REFLEX MICROSCOPIC
BILIRUBIN URINE: NEGATIVE
GLUCOSE, UA: NEGATIVE
Hgb urine dipstick: NEGATIVE
Ketones, ur: NEGATIVE
LEUKOCYTES UA: NEGATIVE
Nitrite: NEGATIVE
PH: 6.5 (ref 5.0–8.0)
PROTEIN: NEGATIVE
SPECIFIC GRAVITY, URINE: 1.019 (ref 1.001–1.035)

## 2016-10-12 LAB — IRON AND TIBC
%SAT: 24 % (ref 11–50)
IRON: 90 ug/dL (ref 45–160)
TIBC: 381 ug/dL (ref 250–450)
UIBC: 291 ug/dL (ref 125–400)

## 2016-10-12 LAB — VITAMIN B12: VITAMIN B 12: 649 pg/mL (ref 200–1100)

## 2016-10-12 LAB — MAGNESIUM: MAGNESIUM: 1.9 mg/dL (ref 1.5–2.5)

## 2016-10-12 LAB — LIPID PANEL
Cholesterol: 178 mg/dL (ref <200)
HDL: 68 mg/dL (ref >50)
LDL Cholesterol: 89 mg/dL (ref <100)
Total CHOL/HDL Ratio: 2.6 Ratio (ref <5.0)
Triglycerides: 104 mg/dL (ref <150)
VLDL: 21 mg/dL (ref <30)

## 2016-10-12 LAB — HEMOGLOBIN A1C
HEMOGLOBIN A1C: 4.7 % (ref <5.7)
Mean Plasma Glucose: 88 mg/dL

## 2016-10-12 LAB — INSULIN, FASTING: INSULIN FASTING, SERUM: 7.5 u[IU]/mL (ref 2.0–19.6)

## 2016-10-12 LAB — VITAMIN D 25 HYDROXY (VIT D DEFICIENCY, FRACTURES): Vit D, 25-Hydroxy: 63 ng/mL (ref 30–100)

## 2016-10-12 LAB — TSH: TSH: 0.88 m[IU]/L

## 2016-10-12 LAB — MICROALBUMIN, URINE: Microalb, Ur: 0.4 mg/dL

## 2016-10-27 ENCOUNTER — Ambulatory Visit
Admission: RE | Admit: 2016-10-27 | Discharge: 2016-10-27 | Disposition: A | Discharge status: Home / Self Care | Payer: Managed Care, Other (non HMO) | Source: Ambulatory Visit | Attending: Internal Medicine | Admitting: Internal Medicine

## 2016-10-27 DIAGNOSIS — N6489 Other specified disorders of breast: Secondary | ICD-10-CM

## 2016-11-01 HISTORY — PX: COLONOSCOPY: SHX174

## 2016-11-02 ENCOUNTER — Ambulatory Visit (INDEPENDENT_AMBULATORY_CARE_PROVIDER_SITE_OTHER): Payer: Managed Care, Other (non HMO) | Admitting: Internal Medicine

## 2016-11-02 ENCOUNTER — Encounter: Payer: Self-pay | Admitting: Internal Medicine

## 2016-11-02 VITALS — BP 136/84 | HR 102 | Temp 98.2°F | Resp 18 | Ht 63.75 in

## 2016-11-02 DIAGNOSIS — J209 Acute bronchitis, unspecified: Secondary | ICD-10-CM | POA: Diagnosis not present

## 2016-11-02 MED ORDER — PROMETHAZINE-DM 6.25-15 MG/5ML PO SYRP: ORAL_SOLUTION | ORAL | 1 refills | Status: DC

## 2016-11-02 MED ORDER — PREDNISONE 20 MG PO TABS: ORAL_TABLET | ORAL | 0 refills | Status: DC

## 2016-11-02 MED ORDER — AZELASTINE HCL 0.1 % NA SOLN: 2.0000 | Freq: Two times a day (BID) | NASAL | 2 refills | Status: DC

## 2016-11-02 MED ORDER — AZITHROMYCIN 250 MG PO TABS: ORAL_TABLET | ORAL | 1 refills | Status: DC

## 2016-11-02 MED ORDER — FLUTICASONE PROPIONATE 50 MCG/ACT NA SUSP: 2.0000 | Freq: Every day | NASAL | 0 refills | Status: DC

## 2016-11-02 NOTE — Progress Notes (Signed)
HPI  Patient presents to the office for evaluation of cough.  It has been going on for 1 weeks.  Patient reports all the time, dry, barky, with chest tightness.  They also endorse chills, fever, postnasal drip, shortness of breath and nasal congestion, sore throat, ear congestion, headache, chest tightness.  .  They have tried decongestant, mucinex DM, zyrtec, nyquil, and afrin.  They report that nothing has worked.  They admits to other sick contacts.  She does work in a Teacher, music.    Review of Systems  Constitutional: Positive for malaise/fatigue. Negative for chills and fever.  HENT: Positive for congestion, ear pain, hearing loss and sore throat.   Respiratory: Positive for cough. Negative for sputum production, shortness of breath and wheezing.   Cardiovascular: Negative for chest pain, palpitations and leg swelling.  Neurological: Positive for headaches.    PE:  Vitals:   11/02/16 1608  BP: 136/84  Pulse: (!) 102  Resp: 18  Temp: 98.2 F (36.8 C)    General:  Alert and non-toxic, WDWN, NAD HEENT: NCAT, PERLA, EOM normal, no occular discharge or erythema.  Nasal mucosal edema with sinus tenderness to palpation.  Oropharynx clear with minimal oropharyngeal edema and erythema.  Mucous membranes moist and pink. Neck:  Cervical adenopathy Chest:  RRR no MRGs.  Lungs clear to auscultation A&P with no wheezes rhonchi or rales.   Abdomen: +BS x 4 quadrants, soft, non-tender, no guarding, rigidity, or rebound. Skin: warm and dry no rash Neuro: A&Ox4, CN II-XII grossly intact  Assessment and Plan:   1. Acute bronchitis, unspecified organism -prednisone -zpak -flonase -astelin -nasal saline -symbicort sample given -duoneb was given

## 2016-11-02 NOTE — Patient Instructions (Signed)
Please take the zpak until it is completely gone.  Please take the prednisone with breakfast until it is gone.  Please continue to take zyrtec daily.  Please use flonase 2 sprays per nostril prior to bedtime  Please use astelin 2 sprays per nostril in the morning and the evening.   Please use saline in your nose as often as you can tolerate.  Please take cough syrup 3 times per day.  Please use 2 puffs of the symbicort twice daily.

## 2016-11-03 ENCOUNTER — Encounter: Payer: Self-pay | Admitting: Internal Medicine

## 2016-11-03 ENCOUNTER — Other Ambulatory Visit: Payer: Self-pay | Admitting: Internal Medicine

## 2016-11-03 DIAGNOSIS — N63 Unspecified lump in unspecified breast: Secondary | ICD-10-CM

## 2016-11-03 MED ORDER — BENZONATATE 200 MG PO CAPS: 200.0000 mg | ORAL_CAPSULE | Freq: Three times a day (TID) | ORAL | 0 refills | Status: DC | PRN

## 2016-11-04 ENCOUNTER — Encounter: Payer: Self-pay | Admitting: Internal Medicine

## 2016-11-08 ENCOUNTER — Other Ambulatory Visit: Payer: Self-pay | Admitting: Physician Assistant

## 2016-12-08 ENCOUNTER — Telehealth: Payer: Self-pay | Admitting: *Deleted

## 2016-12-08 MED ORDER — PREDNISONE 20 MG PO TABS: ORAL_TABLET | ORAL | 0 refills | Status: DC

## 2016-12-08 NOTE — Telephone Encounter (Signed)
Patient was sent home from work with flu-like symptoms and with positive exposure to the flu.  Patient was advised [facility] will not prescribe Tamiflu, however, I advised her to alternate  Motrin and Tylenol for any fever symptoms and Prednisone will be sent into pharmacy for patient. Advised her to call if any symptoms increase.

## 2017-01-03 ENCOUNTER — Other Ambulatory Visit: Payer: Self-pay | Admitting: Internal Medicine

## 2017-01-03 DIAGNOSIS — M7662 Achilles tendinitis, left leg: Secondary | ICD-10-CM

## 2017-02-06 ENCOUNTER — Encounter: Payer: Self-pay | Admitting: Internal Medicine

## 2017-02-07 ENCOUNTER — Other Ambulatory Visit: Payer: Self-pay | Admitting: Internal Medicine

## 2017-02-07 DIAGNOSIS — N951 Menopausal and female climacteric states: Secondary | ICD-10-CM

## 2017-02-07 DIAGNOSIS — M7662 Achilles tendinitis, left leg: Secondary | ICD-10-CM

## 2017-02-07 MED ORDER — ESTRADIOL 0.5 MG PO TABS: ORAL_TABLET | ORAL | 2 refills | Status: DC

## 2017-02-07 MED ORDER — MELOXICAM 15 MG PO TABS: 15.0000 mg | ORAL_TABLET | Freq: Every day | ORAL | 0 refills | Status: DC

## 2017-03-16 ENCOUNTER — Encounter: Payer: Self-pay | Admitting: *Deleted

## 2017-04-15 ENCOUNTER — Ambulatory Visit
Admission: RE | Admit: 2017-04-15 | Discharge: 2017-04-15 | Disposition: A | Discharge status: Home / Self Care | Payer: 59 | Source: Ambulatory Visit | Attending: Internal Medicine | Admitting: Internal Medicine

## 2017-04-15 DIAGNOSIS — N63 Unspecified lump in unspecified breast: Secondary | ICD-10-CM

## 2017-10-06 ENCOUNTER — Other Ambulatory Visit: Payer: Self-pay | Admitting: Internal Medicine

## 2017-10-06 MED ORDER — PREDNISONE 20 MG PO TABS: ORAL_TABLET | ORAL | 0 refills | Status: DC

## 2017-10-06 MED ORDER — AZITHROMYCIN 250 MG PO TABS: ORAL_TABLET | ORAL | 1 refills | Status: DC

## 2017-10-11 ENCOUNTER — Encounter: Payer: Self-pay | Admitting: Adult Health

## 2017-10-12 DIAGNOSIS — N6325 Unspecified lump in the left breast, overlapping quadrants: Secondary | ICD-10-CM | POA: Insufficient documentation

## 2017-10-12 DIAGNOSIS — N6320 Unspecified lump in the left breast, unspecified quadrant: Secondary | ICD-10-CM

## 2017-10-12 HISTORY — DX: Unspecified lump in the left breast, overlapping quadrants: N63.25

## 2017-10-12 NOTE — Progress Notes (Signed)
Complete Physical  Assessment and Plan:  Diagnoses and all orders for this visit:  Encounter for general adult medical examination with abnormal findings  Migraine without status migrainosus, not intractable, unspecified migraine type Well managed by current regimen; topamax daily as prophylaxis with injectable triptan - reports 3-4 annually. She reports ongoing issues with sinus type headaches managed by daily allergy medication, Flonase. Discussed ENT; patient declines.   Gastroesophageal reflux disease, esophagitis presence not specified Well managed with avoidance of triggers Discussed diet, avoiding triggers and other lifestyle changes  Irritable bowel syndrome, with diarrhea Patient reports well managed by avoidance of triggers (fastfoods, etc) Uses miralax daily which has reportedly improved symptoms; discussed imodium as needed  Mixed hyperlipidemia At goal with lifestyle and omega 3 supplementation Continue low cholesterol diet and exercise.  -     Lipid panel -     TSH  Overweight (BMI 25.0-29.9) Long discussion about weight loss, diet, and exercise Recommended diet heavy in fruits and veggies and low in animal meats, cheeses, and dairy products, appropriate calorie intake Discussed appropriate weight for height  Follow up at next visit  Screening for cardiovascular condition -     EKG 12-Lead  Screening for deficiency anemia -     Iron,Total/Total Iron Binding Cap -     Vitamin B12  Screening for diabetes mellitus -     Hemoglobin A1c  Screening for hematuria or proteinuria -     Urinalysis, Complete (81001)  Vitamin D deficiency  -     VITAMIN D 25 Hydroxy (Vit-D Deficiency, Fractures)  Medication management -     CBC with Differential/Platelet -     BASIC METABOLIC PANEL WITH GFR -     Hepatic function panel  Breast lump on left side at 9 o'clock position Noted on 04/2016 screening MMG - Stable on 6 and 12 month f/u diagnostic mammograms and by  ultrasound; likely benign; no further workup recommended other than standard annual MMG - likely benefit from 3D due to cat C density  Bilateral knee pain Was established with Dr. Rhina Brackett With [facility] over 3 years ago for R knee pain/effusions and was receiving steroid injections. She reports similar symptoms now bilaterally and requests referral back.        -   Ambulatory referral to orthopedics  Osteopenia Long history of osteopenia - have been tracking routinely q2-5 years by DEXA - last 3 years ago -requsts recheck this year Recommended weight bearing exercises, continue vitamin D/Calcium supplementation  Discussed med's effects and SE's. Screening labs and tests as requested with regular follow-up as recommended. Over 40 minutes of exam, counseling, chart review, and complex, high level critical decision making was performed this visit.   Future Appointments  Date Time Provider [place]  10/13/2018  9:30 AM [name], NP [facility] None     HPI  51 y.o. female  presents for a complete physical and follow up for has Hyperlipidemia; Migraine headache; GERD; IBS; Abnormal EKG; Overweight (BMI 25.0-29.9); and Breast lump on left side at 9 o'clock position on their problem list. She reports ongoing regular headaches - she describes most are "sinus headaches" - improved by daily allergy medication, regular use of flonase. Discussed ENT - she has considered this, but does not wish to proceed. She reports 3-4 migraines a year for which she is prescribed topamax 50 mg as prophylaxis and injectable triptan as abortive; she reports these medications have been very effective for her.   BMI is Body  mass index is 31.11 kg/m., she has not been working on diet and exercise. She reports increased knee pain and has not been able to walk as she had been doing.  Wt Readings from Last 3 Encounters:  10/13/17 177 lb (80.3 kg)  10/08/16 169 lb (76.7 kg)  01/30/16 158 lb  (71.7 kg)   Today their BP is BP: 124/82 She does not workout. She denies chest pain, shortness of breath, dizziness.   She is not on cholesterol medication and denies myalgias. Her cholesterol is at goal. The cholesterol last visit was:   Lab Results  Component Value Date   CHOL 178 10/08/2016   HDL 68 10/08/2016   LDLCALC 89 10/08/2016   TRIG 104 10/08/2016   CHOLHDL 2.6 10/08/2016    Last A1C in the office was:  Lab Results  Component Value Date   HGBA1C 4.7 10/08/2016   Last GFR: Lab Results  Component Value Date   GFRNONAA >89 10/08/2016   Patient is on Vitamin D supplement and near goal:    Lab Results  Component Value Date   VD25OH 63 10/08/2016      Current Medications:  Current Outpatient Medications on File Prior to Visit  Medication Sig Dispense Refill  . Ascorbic Acid (VITAMIN C PO) Take 1,000 mg by mouth daily.     Marland Kitchen azelastine (ASTELIN) 0.1 % nasal spray Place 2 sprays into both nostrils 2 (two) times daily. Use in each nostril as directed 30 mL 2  . azithromycin (ZITHROMAX Z-PAK) 250 MG tablet 2 po day one, then 1 daily x 4 days 6 tablet 1  . Calcium Carb-Cholecalciferol (CALCIUM + D3) 600-200 MG-UNIT TABS Take 1,200 Units by mouth.    . cetirizine (ZYRTEC ALLERGY) 10 MG tablet Take 10 mg by mouth daily.    . Cholecalciferol (VITAMIN D3) 2000 units TABS Take by mouth.    . fish oil-omega-3 fatty acids 1000 MG capsule Take 1 g by mouth daily.     . fluticasone (FLONASE) 50 MCG/ACT nasal spray Place 2 sprays into both nostrils daily. 16 g 0  . MAGNESIUM PO Take 250 mg by mouth daily.     . meloxicam (MOBIC) 15 MG tablet Take 1 tablet (15 mg total) by mouth daily. (Patient taking differently: Take 15 mg by mouth as needed. ) 90 tablet 0  . predniSONE (DELTASONE) 20 MG tablet 1 tab 3 x day for 3 days, then 1 tab 2 x day for 3 days, then 1 tab 1 x day for 5 days 20 tablet 0  . Probiotic Product (PROBIOTIC PO) Take 2 tablets by mouth daily.     Marland Kitchen topiramate  (TOPAMAX) 50 MG tablet TAKE 1 TABLET (50 MG TOTAL) BY MOUTH 2 (TWO) TIMES DAILY. 180 tablet 3  . ZEMBRACE SYMTOUCH 3 MG/0.5ML SOAJ Inject 1 pen into the skin as needed (Migraine). 3 pen 3  . Cholecalciferol (VITAMIN D) 2000 units CAPS Take by mouth daily.    . [facility] ASPIRIN ADULT LOW DOSE 81 MG chewable tablet TAKE 1 TABLET (81 MG TOTAL) BY MOUTH DAILY. (Patient not taking: Reported on 10/13/2017) 108 tablet 3  . estradiol (ESTRACE) 0.5 MG tablet TAKE 1/2 TABLET BY MOUTH EVERY DAY FOR HOT FLASHES. TAKE WITH ASPIRIN (Patient not taking: Reported on 10/13/2017) 30 tablet 2  . promethazine-dextromethorphan (PROMETHAZINE-DM) 6.25-15 MG/5ML syrup Take 5-10 ML PO q8hrs prn for cough (Patient not taking: Reported on 10/13/2017) 360 mL 1  . SUMAtriptan 6 MG/0.5ML SOAJ Inject 1 dose  sub-cut and may repeat 1 x  in 2 hours Maximum 2 doses /24 hours (Patient not taking: Reported on 10/13/2017) 10 Syringe 1   No current facility-administered medications on file prior to visit.    Allergies:  Allergies  Allergen Reactions  . Oxycodone Other (See Comments)    Hallucinations  . Codeine    Medical History:  She has Hyperlipidemia; Migraine headache; GERD; IBS; Abnormal EKG; Overweight (BMI 25.0-29.9); and Breast lump on left side at 9 o'clock position on their problem list. Health Maintenance:   Immunization History  Administered Date(s) Administered  . Influenza Split 08/27/2014   Tetanus: 2014 Pneumovax: n/a Prevnar 13: n/a Flu vaccine: DUE given today Shingrix: Declines  LMP: s/p hysterectomy Pap: s/p hysterectomy MGM: 04/2017 - stable/shrinking cyst 9'o clock L breast DEXA: 2015 Due -was recommended q 2 year Colonoscopy: 2017 due q5 - [facility] EGD: n/a  Last Dental Exam: 03/2017, scheduled 11/2017 Last Eye Exam: 2018   Patient Care Team: Unk Pinto, MD as PCP - General (Internal Medicine) Jari Pigg, MD as Consulting Physician (Dermatology) Minus Breeding, MD as Consulting Physician  (Cardiology) Sable Feil, MD as Consulting Physician (Gastroenterology)  Surgical History:  She has a past surgical history that includes Vaginal hysterectomy; Oophorectomy; Tubal ligation; Oophorectomy; skin cancer removed; Colonoscopy (11/09/2010); and Polypectomy (11/09/2010). Family History:  Herfamily history includes Arthritis in her father; Cancer in her father and paternal grandfather; Colitis in her brother; Diabetes in her mother; Heart attack in her maternal grandmother; Heart disease (age of onset: 51) in her father; Hypertension in her mother; Kidney disease in her father; Migraines in her paternal grandmother; Miscarriages / [place] in her mother; Other in her paternal grandmother; Stroke in her father. Social History:  She reports that she quit smoking about 10 years ago. Her smoking use included cigarettes. she has never used smokeless tobacco. She reports that she does not drink alcohol or use drugs.  Review of Systems: Review of Systems  Constitutional: Negative for malaise/fatigue and weight loss.  HENT: Negative for hearing loss and tinnitus.   Eyes: Negative for blurred vision and double vision.  Respiratory: Negative for cough, shortness of breath and wheezing.   Cardiovascular: Negative for chest pain, palpitations, orthopnea, claudication and leg swelling.  Gastrointestinal: Positive for diarrhea (Intermittent, associated with trigger foods). Negative for abdominal pain, blood in stool, constipation, heartburn, melena, nausea and vomiting.  Genitourinary: Negative.   Musculoskeletal: Negative for joint pain and myalgias.  Skin: Negative for rash.  Neurological: Positive for headaches (Frequent sinus headaches; rare migraines). Negative for dizziness, tingling, sensory change and weakness.  Endo/Heme/Allergies: Positive for environmental allergies. Negative for polydipsia.  Psychiatric/Behavioral: Negative.  Negative for depression and substance abuse. The patient  is not nervous/anxious and does not have insomnia.   All other systems reviewed and are negative.   Physical Exam: Estimated body mass index is 31.11 kg/m as calculated from the following:   Height as of this encounter: 5' 3.25" (1.607 m).   Weight as of this encounter: 177 lb (80.3 kg). BP 124/82   Pulse 64   Temp (!) 97.2 F (36.2 C)   Ht 5' 3.25" (1.607 m)   Wt 177 lb (80.3 kg)   SpO2 96%   BMI 31.11 kg/m  General Appearance: Well nourished, in no apparent distress.  Eyes: PERRLA, EOMs, conjunctiva no swelling or erythema, normal fundi and vessels.  Sinuses: No Frontal/maxillary tenderness  ENT/Mouth: Ext aud canals clear, normal light reflex with TMs without erythema, bulging. Good  dentition. No erythema, swelling, or exudate on post pharynx. Tonsils not swollen or erythematous. Hearing normal.  Neck: Supple, thyroid normal. No bruits  Respiratory: Respiratory effort normal, BS equal bilaterally without rales, rhonchi, wheezing or stridor.  Cardio: RRR without murmurs, rubs or gallops. Brisk peripheral pulses without edema.  Chest: symmetric, with normal excursions and percussion.  Breasts: Symmetric, without lumps, nipple discharge, retractions.  Abdomen: Soft, nontender, no guarding, rebound, hernias, masses, or organomegaly.  Lymphatics: Non tender without lymphadenopathy.  Genitourinary: Defered; s/p total hysterectomy Musculoskeletal: Full ROM all peripheral extremities,5/5 strength, and normal gait.  Skin: Warm, dry without rashes, lesions, ecchymosis. Neuro: Cranial nerves intact, reflexes equal bilaterally. Normal muscle tone, no cerebellar symptoms. Sensation intact.  Psych: Awake and oriented X 3, normal affect, Insight and Judgment appropriate.   EKG: Stable from previous - no ST changes   Gorden Harms Genese Quebedeaux 10:14 AM [facility], [facility], [facility]

## 2017-10-13 ENCOUNTER — Encounter: Payer: Self-pay | Admitting: Adult Health

## 2017-10-13 ENCOUNTER — Ambulatory Visit (INDEPENDENT_AMBULATORY_CARE_PROVIDER_SITE_OTHER): Payer: 59 | Admitting: Adult Health

## 2017-10-13 VITALS — BP 124/82 | HR 64 | Temp 97.2°F | Ht 63.25 in | Wt 177.0 lb

## 2017-10-13 DIAGNOSIS — Z131 Encounter for screening for diabetes mellitus: Secondary | ICD-10-CM

## 2017-10-13 DIAGNOSIS — Z130 Encounter for screening for diseases of the blood and blood-forming organs and certain disorders involving the immune mechanism: Secondary | ICD-10-CM

## 2017-10-13 DIAGNOSIS — M25561 Pain in right knee: Secondary | ICD-10-CM

## 2017-10-13 DIAGNOSIS — M8588 Other specified disorders of bone density and structure, other site: Secondary | ICD-10-CM

## 2017-10-13 DIAGNOSIS — K580 Irritable bowel syndrome with diarrhea: Secondary | ICD-10-CM

## 2017-10-13 DIAGNOSIS — E782 Mixed hyperlipidemia: Secondary | ICD-10-CM

## 2017-10-13 DIAGNOSIS — I10 Essential (primary) hypertension: Secondary | ICD-10-CM | POA: Diagnosis not present

## 2017-10-13 DIAGNOSIS — M25562 Pain in left knee: Secondary | ICD-10-CM

## 2017-10-13 DIAGNOSIS — E559 Vitamin D deficiency, unspecified: Secondary | ICD-10-CM

## 2017-10-13 DIAGNOSIS — Z79899 Other long term (current) drug therapy: Secondary | ICD-10-CM

## 2017-10-13 DIAGNOSIS — Z136 Encounter for screening for cardiovascular disorders: Secondary | ICD-10-CM

## 2017-10-13 DIAGNOSIS — N6325 Unspecified lump in the left breast, overlapping quadrants: Secondary | ICD-10-CM

## 2017-10-13 DIAGNOSIS — K219 Gastro-esophageal reflux disease without esophagitis: Secondary | ICD-10-CM

## 2017-10-13 DIAGNOSIS — Z23 Encounter for immunization: Secondary | ICD-10-CM | POA: Diagnosis not present

## 2017-10-13 DIAGNOSIS — G8929 Other chronic pain: Secondary | ICD-10-CM

## 2017-10-13 DIAGNOSIS — Z1389 Encounter for screening for other disorder: Secondary | ICD-10-CM

## 2017-10-13 DIAGNOSIS — N6320 Unspecified lump in the left breast, unspecified quadrant: Secondary | ICD-10-CM

## 2017-10-13 DIAGNOSIS — G43909 Migraine, unspecified, not intractable, without status migrainosus: Secondary | ICD-10-CM

## 2017-10-13 DIAGNOSIS — Z0000 Encounter for general adult medical examination without abnormal findings: Secondary | ICD-10-CM | POA: Diagnosis not present

## 2017-10-13 DIAGNOSIS — Z0001 Encounter for general adult medical examination with abnormal findings: Secondary | ICD-10-CM

## 2017-10-13 DIAGNOSIS — E663 Overweight: Secondary | ICD-10-CM

## 2017-10-13 NOTE — Patient Instructions (Addendum)
When setting up mammogram say no to all questions - nothing concerning going on - otherwise will set up for diagnostic which is more expensive  Recommend 3D mammogram - your density category is C  [facility]  7 a.m.-6:30 p.m., Monday 7 a.m.-5 p.m., Tuesday-Friday Schedule an appointment by calling [phone].  [facility] Schedule an appointment by calling [phone].

## 2017-10-14 ENCOUNTER — Encounter: Payer: Self-pay | Admitting: Internal Medicine

## 2017-10-14 LAB — URINALYSIS, COMPLETE
BACTERIA UA: NONE SEEN /HPF
Bilirubin Urine: NEGATIVE
Glucose, UA: NEGATIVE
HGB URINE DIPSTICK: NEGATIVE
HYALINE CAST: NONE SEEN /LPF
Ketones, ur: NEGATIVE
Leukocytes, UA: NEGATIVE
Nitrite: NEGATIVE
PROTEIN: NEGATIVE
RBC / HPF: NONE SEEN /HPF (ref 0–2)
SQUAMOUS EPITHELIAL / LPF: NONE SEEN /HPF (ref <=5)
Specific Gravity, Urine: 1.005 (ref 1.001–1.03)
WBC UA: NONE SEEN /HPF (ref 0–5)
pH: 6 (ref 5.0–8.0)

## 2017-10-14 LAB — BASIC METABOLIC PANEL WITH GFR
BUN: 15 mg/dL (ref 7–25)
CHLORIDE: 104 mmol/L (ref 98–110)
CO2: 26 mmol/L (ref 20–32)
Calcium: 10.5 mg/dL — ABNORMAL HIGH (ref 8.6–10.4)
Creat: 0.78 mg/dL (ref 0.50–1.05)
GFR, EST AFRICAN AMERICAN: 102 mL/min/{1.73_m2} (ref >=60)
GFR, EST NON AFRICAN AMERICAN: 88 mL/min/{1.73_m2} (ref >=60)
Glucose, Bld: 98 mg/dL (ref 65–99)
POTASSIUM: 4.4 mmol/L (ref 3.5–5.3)
SODIUM: 140 mmol/L (ref 135–146)

## 2017-10-14 LAB — LIPID PANEL
CHOLESTEROL: 189 mg/dL (ref <200)
HDL: 74 mg/dL (ref >50)
LDL CHOLESTEROL (CALC): 98 mg/dL
Non-HDL Cholesterol (Calc): 115 mg/dL (calc) (ref <130)
Total CHOL/HDL Ratio: 2.6 (calc) (ref <5.0)
Triglycerides: 83 mg/dL (ref <150)

## 2017-10-14 LAB — CBC WITH DIFFERENTIAL/PLATELET
Basophils Absolute: 26 cells/uL (ref 0–200)
Basophils Relative: 0.2 %
Eosinophils Absolute: 13 cells/uL — ABNORMAL LOW (ref 15–500)
Eosinophils Relative: 0.1 %
HEMATOCRIT: 40 % (ref 35.0–45.0)
Hemoglobin: 13.3 g/dL (ref 11.7–15.5)
Lymphs Abs: 1820 cells/uL (ref 850–3900)
MCH: 29 pg (ref 27.0–33.0)
MCHC: 33.3 g/dL (ref 32.0–36.0)
MCV: 87.3 fL (ref 80.0–100.0)
MPV: 10 fL (ref 7.5–12.5)
Monocytes Relative: 3.3 %
NEUTROS PCT: 82.4 %
Neutro Abs: 10712 cells/uL — ABNORMAL HIGH (ref 1500–7800)
PLATELETS: 313 10*3/uL (ref 140–400)
RBC: 4.58 10*6/uL (ref 3.80–5.10)
RDW: 13.4 % (ref 11.0–15.0)
TOTAL LYMPHOCYTE: 14 %
WBC: 13 10*3/uL — AB (ref 3.8–10.8)
WBCMIX: 429 {cells}/uL (ref 200–950)

## 2017-10-14 LAB — HEPATIC FUNCTION PANEL
AG Ratio: 1.8 (calc) (ref 1.0–2.5)
ALT: 28 U/L (ref 6–29)
AST: 13 U/L (ref 10–35)
Albumin: 4.6 g/dL (ref 3.6–5.1)
Alkaline phosphatase (APISO): 90 U/L (ref 33–130)
BILIRUBIN DIRECT: 0.1 mg/dL (ref 0.0–0.2)
BILIRUBIN INDIRECT: 0.2 mg/dL (ref 0.2–1.2)
BILIRUBIN TOTAL: 0.3 mg/dL (ref 0.2–1.2)
Globulin: 2.5 g/dL (calc) (ref 1.9–3.7)
Total Protein: 7.1 g/dL (ref 6.1–8.1)

## 2017-10-14 LAB — HEMOGLOBIN A1C
HEMOGLOBIN A1C: 5.4 %{Hb} (ref <5.7)
Mean Plasma Glucose: 108 (calc)
eAG (mmol/L): 6 (calc)

## 2017-10-14 LAB — TSH: TSH: 0.48 m[IU]/L

## 2017-10-14 LAB — VITAMIN D 25 HYDROXY (VIT D DEFICIENCY, FRACTURES): Vit D, 25-Hydroxy: 48 ng/mL (ref 30–100)

## 2017-11-05 ENCOUNTER — Other Ambulatory Visit: Payer: Self-pay | Admitting: Physician Assistant

## 2017-11-18 ENCOUNTER — Other Ambulatory Visit: Payer: Self-pay | Admitting: Adult Health

## 2017-11-18 ENCOUNTER — Ambulatory Visit
Admission: RE | Admit: 2017-11-18 | Discharge: 2017-11-18 | Disposition: A | Discharge status: Home / Self Care | Payer: 59 | Source: Ambulatory Visit | Attending: Adult Health | Admitting: Adult Health

## 2017-11-18 DIAGNOSIS — M8588 Other specified disorders of bone density and structure, other site: Secondary | ICD-10-CM

## 2017-11-18 DIAGNOSIS — Z1231 Encounter for screening mammogram for malignant neoplasm of breast: Secondary | ICD-10-CM

## 2017-11-20 ENCOUNTER — Encounter: Payer: Self-pay | Admitting: Adult Health

## 2017-11-20 DIAGNOSIS — M8580 Other specified disorders of bone density and structure, unspecified site: Secondary | ICD-10-CM | POA: Insufficient documentation

## 2017-11-20 DIAGNOSIS — M810 Age-related osteoporosis without current pathological fracture: Secondary | ICD-10-CM | POA: Insufficient documentation

## 2018-01-23 ENCOUNTER — Other Ambulatory Visit: Payer: Self-pay | Admitting: Internal Medicine

## 2018-01-23 ENCOUNTER — Other Ambulatory Visit: Payer: Self-pay | Admitting: Adult Health

## 2018-01-23 DIAGNOSIS — G43909 Migraine, unspecified, not intractable, without status migrainosus: Secondary | ICD-10-CM

## 2018-01-23 MED ORDER — PREDNISONE 20 MG PO TABS: ORAL_TABLET | ORAL | 0 refills | Status: DC

## 2018-01-23 MED ORDER — ZEMBRACE SYMTOUCH 3 MG/0.5ML ~~LOC~~ SOAJ: 1.0000 "pen " | SUBCUTANEOUS | 3 refills | Status: DC | PRN

## 2018-01-24 ENCOUNTER — Other Ambulatory Visit: Payer: Self-pay | Admitting: Internal Medicine

## 2018-01-24 MED ORDER — PREDNISONE 20 MG PO TABS: ORAL_TABLET | ORAL | 0 refills | Status: DC

## 2018-01-24 MED ORDER — PREDNISONE 20 MG PO TABS
ORAL_TABLET | ORAL | 0 refills | Status: DC
Start: 2018-01-24 — End: 2018-01-24

## 2018-03-16 ENCOUNTER — Other Ambulatory Visit: Payer: Self-pay | Admitting: Adult Health

## 2018-03-16 MED ORDER — PHENTERMINE HCL 37.5 MG PO TABS: ORAL_TABLET | ORAL | 5 refills | Status: DC

## 2018-04-21 ENCOUNTER — Ambulatory Visit
Admission: RE | Admit: 2018-04-21 | Discharge: 2018-04-21 | Disposition: A | Discharge status: Home / Self Care | Payer: 59 | Source: Ambulatory Visit | Attending: Adult Health | Admitting: Adult Health

## 2018-04-21 DIAGNOSIS — Z1231 Encounter for screening mammogram for malignant neoplasm of breast: Secondary | ICD-10-CM

## 2018-08-28 ENCOUNTER — Ambulatory Visit (INDEPENDENT_AMBULATORY_CARE_PROVIDER_SITE_OTHER): Payer: 59

## 2018-08-28 VITALS — Temp 96.6°F

## 2018-08-28 DIAGNOSIS — Z23 Encounter for immunization: Secondary | ICD-10-CM

## 2018-10-11 ENCOUNTER — Encounter: Payer: Self-pay | Admitting: Adult Health

## 2018-10-11 NOTE — Progress Notes (Signed)
Complete Physical  Assessment and Plan:  Diagnoses and all orders for this visit:  Encounter for general adult medical examination with abnormal findings  Migraine without status migrainosus, not intractable, unspecified migraine type Well managed by current regimen; topamax daily as prophylaxis with injectable triptan - reports 3-4 annually. She reports ongoing issues with sinus type headaches managed by daily allergy medication, Flonase. Discussed ENT; patient declines.   Gastroesophageal reflux disease, esophagitis presence not specified Well managed with avoidance of triggers Discussed diet, avoiding triggers and other lifestyle changes  Irritable bowel syndrome, with diarrhea Patient reports well managed by avoidance of triggers (fastfoods, etc) Uses miralax daily which has reportedly improved symptoms; discussed imodium as needed  Mixed hyperlipidemia At goal with lifestyle and omega 3 supplementation Continue low cholesterol diet and exercise.  -     Lipid panel -     TSH  Overweight (BMI 25.0-29.9) Long discussion about weight loss, diet, and exercise Recommended diet heavy in fruits and veggies and low in animal meats, cheeses, and dairy products, appropriate calorie intake Discussed appropriate weight for height  Follow up at next visit  Screening for cardiovascular condition -     EKG 12-Lead  Screening for deficiency anemia -     Iron,Total/Total Iron Binding Cap -     Vitamin B12  Screening for diabetes mellitus -     Hemoglobin A1c  Screening for hematuria or proteinuria -     Urinalysis, Complete (81001)  Vitamin D deficiency -     VITAMIN D 25 Hydroxy (Vit-D Deficiency, Fractures)  Medication management -     CBC with Differential/Platelet -     CMPGFR  Bilateral knee pain Was established with Dr. Rhina Brackett With [facility], for knee pain/effusions and was receiving steroid injections.  Call insurance about coverage/cost issues; NSAIDs  have not been very effective, injections helpful but last visit was costly at $600+ out of pocket  Osteopenia Long history of osteopenia - have been tracking routinely q2-5 years by DEXA - last early 2019 Recommended weight bearing exercises, continue vitamin D/Calcium supplementation  Discussed med's effects and SE's. Screening labs and tests as requested with regular follow-up as recommended. Over 40 minutes of exam, counseling, chart review, and complex, high level critical decision making was performed this visit.   Future Appointments  Date Time Provider [place]  10/16/2019  9:00 AM [name], NP [facility] None     HPI  52 y.o. female  presents for a complete physical and follow up for has Hyperlipidemia; Migraine headache; GERD; IBS; Abnormal EKG; Overweight (BMI 25.0-29.9); and Osteopenia on their problem list.   She reports ongoing regular headaches - she describes most are "sinus headaches" - improved by daily allergy medication, regular use of flonase. Discussed ENT - she has considered this, but does not wish to proceed. She reports 3-4 migraines a year for which she is prescribed topamax 50 mg as prophylaxis and injectable triptan as abortive; she reports these medications have been very effective for her.   She has bilateral knee/foot pain, sees Dr. Rip Harbour at [facility], getting injections in knees and possible plantal fascitis. She takes mobic, ibuprofen but feels benefit has been limited.   BMI is Body mass index is 29.09 kg/m., she has been working on diet, exercise limited by ortho conditions. She reports increased knee pain and has not been able to walk as she had been doing. She reports water intake - 90+ fluid ounces. Sleeps 7 hours nightly.  Wt  Readings from Last 3 Encounters:  10/13/18 164 lb 3.2 oz (74.5 kg)  10/13/17 177 lb (80.3 kg)  10/08/16 169 lb (76.7 kg)   Today their BP is BP: 130/82 She does not workout. She denies chest pain,  shortness of breath, dizziness.   She is not on cholesterol medication and denies myalgias. Her cholesterol is at goal. The cholesterol last visit was:   Lab Results  Component Value Date   CHOL 189 10/13/2017   HDL 74 10/13/2017   LDLCALC 98 10/13/2017   TRIG 83 10/13/2017   CHOLHDL 2.6 10/13/2017    Last A1C in the office was:  Lab Results  Component Value Date   HGBA1C 5.4 10/13/2017   Last GFR: Lab Results  Component Value Date   GFRNONAA 88 10/13/2017   Patient is on Vitamin D supplement and near goal:    Lab Results  Component Value Date   VD25OH 48 10/13/2017      Current Medications:  Current Outpatient Medications on File Prior to Visit  Medication Sig Dispense Refill  . Ascorbic Acid (VITAMIN C PO) Take 1,000 mg by mouth daily.     . Calcium Carb-Cholecalciferol (CALCIUM + D3) 600-200 MG-UNIT TABS Take 1,200 Units by mouth.    . Cholecalciferol (VITAMIN D3) 2000 units TABS Take 4,000 Units by mouth.     Marland Kitchen MAGNESIUM PO Take 250 mg by mouth daily.     . meloxicam (MOBIC) 15 MG tablet Take 1 tablet (15 mg total) by mouth daily. (Patient taking differently: Take 15 mg by mouth as needed. ) 90 tablet 0  . predniSONE (DELTASONE) 20 MG tablet Take as directed for severe headache (Patient taking differently: Take as needed for severe headache) 30 tablet 0  . Probiotic Product (PROBIOTIC PO) Take 2 tablets by mouth daily.     . psyllium (METAMUCIL) 58.6 % powder Take 1 packet by mouth daily.    Marland Kitchen topiramate (TOPAMAX) 50 MG tablet TAKE 1 TABLET (50 MG TOTAL) BY MOUTH 2 (TWO) TIMES DAILY. 180 tablet 3  . ZEMBRACE SYMTOUCH 3 MG/0.5ML SOAJ Inject 1 pen into the skin as needed (Migraine). 3 pen 3  . azelastine (ASTELIN) 0.1 % nasal spray Place 2 sprays into both nostrils 2 (two) times daily. Use in each nostril as directed (Patient not taking: Reported on 10/13/2018) 30 mL 2  . cetirizine (ZYRTEC ALLERGY) 10 MG tablet Take 10 mg by mouth daily.    . fish oil-omega-3 fatty acids  1000 MG capsule Take 1 g by mouth daily.     . fluticasone (FLONASE) 50 MCG/ACT nasal spray Place 2 sprays into both nostrils daily. (Patient not taking: Reported on 10/13/2018) 16 g 0  . phentermine (ADIPEX-P) 37.5 MG tablet Take 1/2 to 1 tablet every morning for dieting & weightloss (Patient not taking: Reported on 10/13/2018) 30 tablet 5   No current facility-administered medications on file prior to visit.    Allergies:  Allergies  Allergen Reactions  . Oxycodone Other (See Comments)    Hallucinations  . Codeine    Medical History:  She has Hyperlipidemia; Migraine headache; GERD; IBS; Abnormal EKG; Overweight (BMI 25.0-29.9); and Osteopenia on their problem list. Health Maintenance:   Immunization History  Administered Date(s) Administered  . Influenza Inj Mdck Quad With Preservative 10/13/2017, 08/28/2018  . Influenza Split 08/27/2014   Tetanus: 2014 Pneumovax: n/a Prevnar 13: n/a Flu vaccine: 2019 Shingrix: Declines  LMP: s/p hysterectomy Pap: s/p hysterectomy MGM: 04/2018  DEXA: 2019 - T -1.6 -  was recommended q 2 year Colonoscopy: 2017 due q5 - [facility] EGD: n/a  Last Dental Exam: Dr. Juleen China & [facility], last 2019, goes q64m Last Eye Exam: 2018, wears glasses  Last Derm: Dr. Delman Cheadle, last visit few years ago  Patient Care Team: Unk Pinto, MD as PCP - General (Internal Medicine) Jari Pigg, MD as Consulting Physician (Dermatology) Minus Breeding, MD as Consulting Physician (Cardiology) Sable Feil, MD as Consulting Physician (Gastroenterology)  Surgical History:  She has a past surgical history that includes Vaginal hysterectomy; Oophorectomy; Tubal ligation; Oophorectomy; skin cancer removed; Colonoscopy (11/09/2010); and Polypectomy (11/09/2010). Family History:  Herfamily history includes Arthritis in her father; Cancer in her father and paternal grandfather; Colitis in her brother; Diabetes in her mother; Heart attack in her maternal grandmother; Heart  disease in her paternal grandfather; Heart disease (age of onset: 1) in her father; Hypertension in her mother; Kidney disease in her father; Migraines in her paternal grandmother; Miscarriages / [place] in her mother; Other in her paternal grandmother; Stroke in her father. Social History:  She reports that she quit smoking about 11 years ago. Her smoking use included cigarettes. She has never used smokeless tobacco. She reports that she does not drink alcohol or use drugs.  Review of Systems: Review of Systems  Constitutional: Negative for malaise/fatigue and weight loss.  HENT: Negative for hearing loss and tinnitus.   Eyes: Negative for blurred vision and double vision.  Respiratory: Negative for cough, shortness of breath and wheezing.   Cardiovascular: Negative for chest pain, palpitations, orthopnea, claudication and leg swelling.  Gastrointestinal: Positive for diarrhea (Intermittent, associated with trigger foods). Negative for abdominal pain, blood in stool, constipation, heartburn, melena, nausea and vomiting.  Genitourinary: Negative.   Musculoskeletal: Negative for joint pain and myalgias.  Skin: Negative for rash.  Neurological: Positive for headaches (Frequent sinus headaches; rare migraines). Negative for dizziness, tingling, sensory change and weakness.  Endo/Heme/Allergies: Positive for environmental allergies. Negative for polydipsia.  Psychiatric/Behavioral: Negative.  Negative for depression and substance abuse. The patient is not nervous/anxious and does not have insomnia.   All other systems reviewed and are negative.   Physical Exam: Estimated body mass index is 29.09 kg/m as calculated from the following:   Height as of this encounter: 5\' 3"  (1.6 m).   Weight as of this encounter: 164 lb 3.2 oz (74.5 kg). BP 130/82   Pulse 87   Temp 98.1 F (36.7 C)   Ht 5\' 3"  (1.6 m)   Wt 164 lb 3.2 oz (74.5 kg)   SpO2 98%   BMI 29.09 kg/m  General Appearance: Well  nourished, in no apparent distress.  Eyes: PERRLA, EOMs, conjunctiva no swelling or erythema, normal fundi and vessels.  Sinuses: No Frontal/maxillary tenderness  ENT/Mouth: Ext aud canals clear, normal light reflex with TMs without erythema, bulging. Good dentition. No erythema, swelling, or exudate on post pharynx. Tonsils not swollen or erythematous. Hearing normal.  Neck: Supple, thyroid normal. No bruits  Respiratory: Respiratory effort normal, BS equal bilaterally without rales, rhonchi, wheezing or stridor.  Cardio: RRR without murmurs, rubs or gallops. Brisk peripheral pulses without edema.  Chest: symmetric, with normal excursions and percussion.  Breasts: Symmetric, without lumps, nipple discharge, retractions.  Abdomen: Soft, nontender, no guarding, rebound, hernias, masses, or organomegaly.  Lymphatics: Non tender without lymphadenopathy.  Genitourinary: Defered; s/p total hysterectomy Musculoskeletal: Full ROM all peripheral extremities,5/5 strength, and normal gait.  Skin: Warm, dry without rashes, lesions, ecchymosis. Neuro: Cranial nerves intact, reflexes equal bilaterally.  Normal muscle tone, no cerebellar symptoms. Sensation intact.  Psych: Awake and oriented X 3, normal affect, Insight and Judgment appropriate.   EKG: Stable from previous - no ST changes   Kelly Mclaughlin 9:31 AM [facility]

## 2018-10-13 ENCOUNTER — Ambulatory Visit (INDEPENDENT_AMBULATORY_CARE_PROVIDER_SITE_OTHER): Payer: 59 | Admitting: Adult Health

## 2018-10-13 ENCOUNTER — Encounter: Payer: Self-pay | Admitting: Adult Health

## 2018-10-13 VITALS — BP 130/82 | HR 87 | Temp 98.1°F | Ht 63.0 in | Wt 164.2 lb

## 2018-10-13 DIAGNOSIS — E663 Overweight: Secondary | ICD-10-CM

## 2018-10-13 DIAGNOSIS — K580 Irritable bowel syndrome with diarrhea: Secondary | ICD-10-CM

## 2018-10-13 DIAGNOSIS — E782 Mixed hyperlipidemia: Secondary | ICD-10-CM

## 2018-10-13 DIAGNOSIS — G43909 Migraine, unspecified, not intractable, without status migrainosus: Secondary | ICD-10-CM

## 2018-10-13 DIAGNOSIS — I10 Essential (primary) hypertension: Secondary | ICD-10-CM | POA: Diagnosis not present

## 2018-10-13 DIAGNOSIS — Z0001 Encounter for general adult medical examination with abnormal findings: Secondary | ICD-10-CM

## 2018-10-13 DIAGNOSIS — D649 Anemia, unspecified: Secondary | ICD-10-CM

## 2018-10-13 DIAGNOSIS — Z0000 Encounter for general adult medical examination without abnormal findings: Secondary | ICD-10-CM | POA: Diagnosis not present

## 2018-10-13 DIAGNOSIS — R9431 Abnormal electrocardiogram [ECG] [EKG]: Secondary | ICD-10-CM

## 2018-10-13 DIAGNOSIS — Z136 Encounter for screening for cardiovascular disorders: Secondary | ICD-10-CM | POA: Diagnosis not present

## 2018-10-13 DIAGNOSIS — M8580 Other specified disorders of bone density and structure, unspecified site: Secondary | ICD-10-CM

## 2018-10-13 DIAGNOSIS — Z131 Encounter for screening for diabetes mellitus: Secondary | ICD-10-CM

## 2018-10-13 DIAGNOSIS — Z79899 Other long term (current) drug therapy: Secondary | ICD-10-CM

## 2018-10-13 DIAGNOSIS — K219 Gastro-esophageal reflux disease without esophagitis: Secondary | ICD-10-CM

## 2018-10-13 DIAGNOSIS — E559 Vitamin D deficiency, unspecified: Secondary | ICD-10-CM

## 2018-10-13 NOTE — Patient Instructions (Signed)
  Kelly Mclaughlin , Thank you for taking time to come for your Annual Wellness Visit. I appreciate your ongoing commitment to your health goals. Please review the following plan we discussed and let me know if I can assist you in the future.   These are the goals we discussed: Goals    . Weight (lb) < 155 lb (70.3 kg)       This is a list of the screening recommended for you and due dates:  Health Maintenance  Topic Date Due  . HIV Screening  11/06/1980  . Mammogram  04/21/2020  . Colon Cancer Screening  01/29/2021  . Tetanus Vaccine  04/12/2023  . Flu Shot  Completed   Know what a healthy weight is for you (roughly BMI <25) and aim to maintain this  Aim for 7+ servings of fruits and vegetables daily  65-80+ fluid ounces of water or unsweet tea for healthy kidneys  Limit to max 1 drink of alcohol per day; avoid smoking/tobacco  Limit animal fats in diet for cholesterol and heart health - choose grass fed whenever available  Avoid highly processed foods, and foods high in saturated/trans fats  Aim for low stress - take time to unwind and care for your mental health  Aim for 150 min of moderate intensity exercise weekly for heart health, and weights twice weekly for bone health  Aim for 7-9 hours of sleep daily       When it comes to diets, agreement about the perfect plan isn't easy to find, even among the experts. Experts at the [employer] developed an idea known as the Healthy Eating Plate. Just imagine a plate divided into logical, healthy portions.  The emphasis is on diet quality:  Load up on vegetables and fruits - one-half of your plate: Aim for color and variety, and remember that potatoes don't count.  Go for whole grains - one-quarter of your plate: Whole wheat, barley, wheat berries, quinoa, oats, brown rice, and foods made with them. If you want pasta, go with whole wheat pasta.  Protein power - one-quarter of your plate: Fish, chicken,  beans, and nuts are all healthy, versatile protein sources. Limit red meat.  The diet, however, does go beyond the plate, offering a few other suggestions.  Use healthy plant oils, such as olive, canola, soy, corn, sunflower and peanut. Check the labels, and avoid partially hydrogenated oil, which have unhealthy trans fats.  If you're thirsty, drink water. Coffee and tea are good in moderation, but skip sugary drinks and limit milk and dairy products to one or two daily servings.  The type of carbohydrate in the diet is more important than the amount. Some sources of carbohydrates, such as vegetables, fruits, whole grains, and beans-are healthier than others.  Finally, stay active.

## 2018-10-14 LAB — CBC WITH DIFFERENTIAL/PLATELET
BASOS ABS: 33 {cells}/uL (ref 0–200)
BASOS PCT: 0.5 %
EOS ABS: 111 {cells}/uL (ref 15–500)
Eosinophils Relative: 1.7 %
HEMATOCRIT: 37.9 % (ref 35.0–45.0)
HEMOGLOBIN: 13.1 g/dL (ref 11.7–15.5)
LYMPHS ABS: 1950 {cells}/uL (ref 850–3900)
MCH: 30 pg (ref 27.0–33.0)
MCHC: 34.6 g/dL (ref 32.0–36.0)
MCV: 86.9 fL (ref 80.0–100.0)
MPV: 10.2 fL (ref 7.5–12.5)
Monocytes Relative: 4.5 %
NEUTROS ABS: 4115 {cells}/uL (ref 1500–7800)
Neutrophils Relative %: 63.3 %
Platelets: 253 10*3/uL (ref 140–400)
RBC: 4.36 10*6/uL (ref 3.80–5.10)
RDW: 13 % (ref 11.0–15.0)
Total Lymphocyte: 30 %
WBC mixed population: 293 cells/uL (ref 200–950)
WBC: 6.5 10*3/uL (ref 3.8–10.8)

## 2018-10-14 LAB — URINALYSIS, ROUTINE W REFLEX MICROSCOPIC
BILIRUBIN URINE: NEGATIVE
GLUCOSE, UA: NEGATIVE
HGB URINE DIPSTICK: NEGATIVE
KETONES UR: NEGATIVE
Leukocytes, UA: NEGATIVE
Nitrite: NEGATIVE
PROTEIN: NEGATIVE
Specific Gravity, Urine: 1.017 (ref 1.001–1.03)
pH: 6.5 (ref 5.0–8.0)

## 2018-10-14 LAB — IRON, TOTAL/TOTAL IRON BINDING CAP
%SAT: 26 % (calc) (ref 16–45)
Iron: 76 ug/dL (ref 45–160)
TIBC: 287 mcg/dL (calc) (ref 250–450)

## 2018-10-14 LAB — VITAMIN B12: Vitamin B-12: 1105 pg/mL — ABNORMAL HIGH (ref 200–1100)

## 2018-10-14 LAB — VITAMIN D 25 HYDROXY (VIT D DEFICIENCY, FRACTURES): Vit D, 25-Hydroxy: 73 ng/mL (ref 30–100)

## 2018-10-14 LAB — COMPLETE METABOLIC PANEL WITH GFR
AG Ratio: 2.1 (calc) (ref 1.0–2.5)
ALBUMIN MSPROF: 4.6 g/dL (ref 3.6–5.1)
ALT: 35 U/L — ABNORMAL HIGH (ref 6–29)
AST: 21 U/L (ref 10–35)
Alkaline phosphatase (APISO): 89 U/L (ref 33–130)
BILIRUBIN TOTAL: 0.4 mg/dL (ref 0.2–1.2)
BUN: 11 mg/dL (ref 7–25)
CO2: 25 mmol/L (ref 20–32)
CREATININE: 0.76 mg/dL (ref 0.50–1.05)
Calcium: 9.6 mg/dL (ref 8.6–10.4)
Chloride: 108 mmol/L (ref 98–110)
GFR, EST AFRICAN AMERICAN: 105 mL/min/{1.73_m2} (ref >=60)
GFR, Est Non African American: 90 mL/min/{1.73_m2} (ref >=60)
GLOBULIN: 2.2 g/dL (ref 1.9–3.7)
Glucose, Bld: 72 mg/dL (ref 65–99)
Potassium: 3.8 mmol/L (ref 3.5–5.3)
SODIUM: 141 mmol/L (ref 135–146)
TOTAL PROTEIN: 6.8 g/dL (ref 6.1–8.1)

## 2018-10-14 LAB — LIPID PANEL
Cholesterol: 173 mg/dL (ref <200)
HDL: 56 mg/dL (ref >50)
LDL CHOLESTEROL (CALC): 98 mg/dL
NON-HDL CHOLESTEROL (CALC): 117 mg/dL (ref <130)
TRIGLYCERIDES: 93 mg/dL (ref <150)
Total CHOL/HDL Ratio: 3.1 (calc) (ref <5.0)

## 2018-10-14 LAB — HEMOGLOBIN A1C
EAG (MMOL/L): 5.5 (calc)
Hgb A1c MFr Bld: 5.1 % of total Hgb (ref <5.7)
MEAN PLASMA GLUCOSE: 100 (calc)

## 2018-10-14 LAB — TSH: TSH: 0.85 m[IU]/L

## 2018-10-14 LAB — MAGNESIUM: Magnesium: 2 mg/dL (ref 1.5–2.5)

## 2018-11-02 ENCOUNTER — Other Ambulatory Visit: Payer: Self-pay | Admitting: Internal Medicine

## 2019-01-19 ENCOUNTER — Other Ambulatory Visit: Payer: Self-pay | Admitting: Adult Health

## 2019-01-19 DIAGNOSIS — Z1231 Encounter for screening mammogram for malignant neoplasm of breast: Secondary | ICD-10-CM

## 2019-04-27 ENCOUNTER — Ambulatory Visit: Payer: 59

## 2019-05-10 ENCOUNTER — Ambulatory Visit
Admission: RE | Admit: 2019-05-10 | Discharge: 2019-05-10 | Disposition: A | Discharge status: Home / Self Care | Payer: 59 | Source: Ambulatory Visit | Attending: Internal Medicine | Admitting: Internal Medicine

## 2019-05-10 ENCOUNTER — Other Ambulatory Visit: Payer: Self-pay | Admitting: Internal Medicine

## 2019-05-10 ENCOUNTER — Other Ambulatory Visit: Payer: Self-pay

## 2019-05-10 DIAGNOSIS — M79672 Pain in left foot: Secondary | ICD-10-CM

## 2019-05-25 ENCOUNTER — Other Ambulatory Visit: Payer: Self-pay

## 2019-05-25 ENCOUNTER — Ambulatory Visit
Admission: RE | Admit: 2019-05-25 | Discharge: 2019-05-25 | Disposition: A | Discharge status: Home / Self Care | Payer: 59 | Source: Ambulatory Visit | Attending: Adult Health | Admitting: Adult Health

## 2019-05-25 DIAGNOSIS — Z1231 Encounter for screening mammogram for malignant neoplasm of breast: Secondary | ICD-10-CM

## 2019-08-03 ENCOUNTER — Other Ambulatory Visit: Payer: Self-pay | Admitting: Internal Medicine

## 2019-08-03 MED ORDER — PREDNISONE 20 MG PO TABS: ORAL_TABLET | ORAL | 0 refills | Status: DC

## 2019-08-03 MED ORDER — CIPROFLOXACIN HCL 250 MG PO TABS: ORAL_TABLET | ORAL | 0 refills | Status: DC

## 2019-08-03 MED ORDER — AZITHROMYCIN 250 MG PO TABS: ORAL_TABLET | ORAL | 1 refills | Status: DC

## 2019-08-21 ENCOUNTER — Ambulatory Visit (INDEPENDENT_AMBULATORY_CARE_PROVIDER_SITE_OTHER): Payer: 59

## 2019-08-21 ENCOUNTER — Other Ambulatory Visit: Payer: Self-pay

## 2019-08-21 VITALS — Temp 96.8°F

## 2019-08-21 DIAGNOSIS — Z23 Encounter for immunization: Secondary | ICD-10-CM | POA: Diagnosis not present

## 2019-10-01 ENCOUNTER — Other Ambulatory Visit: Payer: Self-pay

## 2019-10-01 DIAGNOSIS — Z20822 Contact with and (suspected) exposure to covid-19: Secondary | ICD-10-CM

## 2019-10-02 LAB — NOVEL CORONAVIRUS, NAA: SARS-CoV-2, NAA: DETECTED — AB

## 2019-10-05 ENCOUNTER — Other Ambulatory Visit: Payer: Self-pay | Admitting: Internal Medicine

## 2019-10-05 MED ORDER — BENZONATATE 200 MG PO CAPS: ORAL_CAPSULE | ORAL | 1 refills | Status: DC

## 2019-10-05 MED ORDER — DEXAMETHASONE 4 MG PO TABS: ORAL_TABLET | ORAL | 0 refills | Status: DC

## 2019-10-09 ENCOUNTER — Other Ambulatory Visit: Payer: Self-pay | Admitting: Internal Medicine

## 2019-10-15 NOTE — Progress Notes (Deleted)
Complete Physical  Assessment and Plan:  Diagnoses and all orders for this visit:  Encounter for general adult medical examination with abnormal findings  Migraine without status migrainosus, not intractable, unspecified migraine type Well managed by current regimen; topamax daily as prophylaxis with injectable triptan - reports 3-4 annually. She reports ongoing issues with sinus type headaches managed by daily allergy medication, Flonase. Discussed ENT; patient declines.   Gastroesophageal reflux disease, esophagitis presence not specified Well managed with avoidance of triggers Discussed diet, avoiding triggers and other lifestyle changes  Irritable bowel syndrome, with diarrhea Patient reports well managed by avoidance of triggers (fastfoods, etc) Uses miralax daily which has reportedly improved symptoms; discussed imodium as needed  Mixed hyperlipidemia At goal with lifestyle and omega 3 supplementation Continue low cholesterol diet and exercise.  -     Lipid panel -     TSH  Overweight (BMI 25.0-29.9) Long discussion about weight loss, diet, and exercise Recommended diet heavy in fruits and veggies and low in animal meats, cheeses, and dairy products, appropriate calorie intake Discussed appropriate weight for height  Follow up at next visit  Screening for cardiovascular condition/Hx of abnormal EKG History of abnormal EKG; stable Has seen cardiology with normal ECHO 2012 Monitor  -     EKG 12-Lead  Screening for diabetes mellitus -     Hemoglobin A1c  Screening for hematuria or proteinuria -     Urinalysis, Complete (81001)  Vitamin D deficiency -     VITAMIN D 25 Hydroxy (Vit-D Deficiency, Fractures)  Medication management -     CBC with Differential/Platelet -     CMPGFR  Bilateral knee pain Was established with Dr. Rhina Brackett With [facility], for knee pain/effusions and was receiving steroid injections.  Call insurance about coverage/cost  issues; NSAIDs have not been very effective, injections helpful but last visit was costly at $600+ out of pocket ***  Osteopenia Long history of osteopenia - have been tracking routinely q2-5 years by DEXA - last early 2019 Recommended weight bearing exercises, continue vitamin D/Calcium supplementation  Discussed med's effects and SE's. Screening labs and tests as requested with regular follow-up as recommended. Over 40 minutes of exam, counseling, chart review, and complex, high level critical decision making was performed this visit.   Future Appointments  Date Time Provider [place]  10/16/2019  9:00 AM [name], NP [facility] None  10/15/2020  9:00 AM [name], NP [facility] None     HPI  53 y.o. female  presents for a complete physical and follow up for has Hyperlipidemia; Migraine headache; GERD; IBS; Abnormal EKG; Overweight (BMI 25.0-29.9); and Osteopenia on their problem list.   ***  She reports ongoing regular headaches - she describes most are "sinus headaches" - improved by daily allergy medication, regular use of flonase. She has declined ENT evaluation. She reports 3-4 migraines a year for which she is prescribed topamax 50 mg as prophylaxis and injectable triptan as abortive; she reports these medications have been very effective for her.   She has bilateral knee/foot pain, sees Dr. Rip Harbour at [facility], getting injections in knees and possible plantal fascitis. She takes mobic, ibuprofen but feels benefit has been limited.   BMI is There is no height or weight on file to calculate BMI., she has been working on diet, exercise limited by ortho conditions. She reports increased knee pain and has not been able to walk as she had been doing. She reports water intake - 90+ fluid ounces.  Sleeps 7 hours nightly.  Wt Readings from Last 3 Encounters:  10/13/18 164 lb 3.2 oz (74.5 kg)  10/13/17 177 lb (80.3 kg)  10/08/16 169 lb (76.7 kg)   Today their  BP is   She does not workout. She denies chest pain, shortness of breath, dizziness.   She is not on cholesterol medication and denies myalgias. Her cholesterol is at goal. The cholesterol last visit was:   Lab Results  Component Value Date   CHOL 173 10/13/2018   HDL 56 10/13/2018   LDLCALC 98 10/13/2018   TRIG 93 10/13/2018   CHOLHDL 3.1 10/13/2018    Last A1C in the office was:  Lab Results  Component Value Date   HGBA1C 5.1 10/13/2018   Last GFR: Lab Results  Component Value Date   GFRNONAA 90 10/13/2018   Patient is on Vitamin D supplement and at goal:    Lab Results  Component Value Date   VD25OH 73 10/13/2018        Current Medications:  Current Outpatient Medications on File Prior to Visit  Medication Sig Dispense Refill  . Ascorbic Acid (VITAMIN C PO) Take 1,000 mg by mouth daily.     Marland Kitchen azithromycin (ZITHROMAX) 250 MG tablet Take 2 tablets (500 mg) on  Day 1,  followed by 1 tablet (250 mg) once daily on Days 2 through 5. 6 each 1  . benzonatate (TESSALON) 200 MG capsule Take 1 perle 3 x / day to prevent cough 30 capsule 1  . Calcium Carb-Cholecalciferol (CALCIUM + D3) 600-200 MG-UNIT TABS Take 1,200 Units by mouth.    . Cholecalciferol (VITAMIN D3) 2000 units TABS Take 4,000 Units by mouth.     . dexamethasone (DECADRON) 4 MG tablet Take 1 tab 3 x day - 3 days, then 2 x day - 3 days, then 1 tab daily 20 tablet 0  . fexofenadine (ALLEGRA) 180 MG tablet Take 180 mg by mouth daily.    . fish oil-omega-3 fatty acids 1000 MG capsule Take 1 g by mouth daily.     Marland Kitchen MAGNESIUM PO Take 250 mg by mouth daily.     . meloxicam (MOBIC) 15 MG tablet Take 1 tablet (15 mg total) by mouth daily. (Patient taking differently: Take 15 mg by mouth as needed. ) 90 tablet 0  . Probiotic Product (PROBIOTIC PO) Take 2 tablets by mouth daily.     . psyllium (METAMUCIL) 58.6 % powder Take 1 packet by mouth daily.    Marland Kitchen topiramate (TOPAMAX) 50 MG tablet TAKE 1 TABLET BY MOUTH TWICE A DAY 180  tablet 3  . ZEMBRACE SYMTOUCH 3 MG/0.5ML SOAJ Inject 1 pen into the skin as needed (Migraine). 3 pen 3   No current facility-administered medications on file prior to visit.   Allergies:  Allergies  Allergen Reactions  . Oxycodone Other (See Comments)    Hallucinations  . Codeine    Medical History:  She has Hyperlipidemia; Migraine headache; GERD; IBS; Abnormal EKG; Overweight (BMI 25.0-29.9); and Osteopenia on their problem list. Health Maintenance:   Immunization History  Administered Date(s) Administered  . Influenza Inj Mdck Quad With Preservative 10/13/2017, 08/28/2018, 08/21/2019  . Influenza Split 08/27/2014   Tetanus: 2014 Pneumovax: n/a Prevnar 13: n/a Flu vaccine: 08/2019 Shingrix: Declines  LMP: s/p hysterectomy Pap: s/p hysterectomy MGM: 05/2019 DEXA: 2019 - T -1.6 -was recommended q 2 year  ECHO: 2012 -no significant abnormalities, EF 55-60%  Colonoscopy: 2017 due q5 - [facility] EGD: n/a  Last  Dental Exam: Dr. Juleen China & [facility], last 2019, goes q45m Last Eye Exam: 2018, wears glasses  Last Derm: Dr. Delman Cheadle, last visit few years ago  Patient Care Team: Unk Pinto, MD as PCP - General (Internal Medicine) Jari Pigg, MD as Consulting Physician (Dermatology) Minus Breeding, MD as Consulting Physician (Cardiology) Sable Feil, MD as Consulting Physician (Gastroenterology)  Surgical History:  She has a past surgical history that includes Vaginal hysterectomy; Tubal ligation; Oophorectomy; skin cancer removed; Colonoscopy (11/09/2010); and Polypectomy (11/09/2010). Family History:  Herfamily history includes Arthritis in her father; Cancer in her father; Colitis in her brother; Diabetes in her mother; Heart attack in her maternal grandmother; Heart disease in her paternal grandfather; Heart disease (age of onset: 26) in her father; Hypertension in her mother; Kidney disease in her father; Migraines in her paternal grandmother; Miscarriages / [place]  in her mother; Other in her paternal grandmother; Pancreatic cancer in her paternal grandfather; Stroke in her father. Social History:  She reports that she quit smoking about 12 years ago. Her smoking use included cigarettes. She has a 10.00 pack-year smoking history. She has never used smokeless tobacco. She reports that she does not drink alcohol or use drugs.  Review of Systems: Review of Systems  Constitutional: Negative for malaise/fatigue and weight loss.  HENT: Negative for hearing loss and tinnitus.   Eyes: Negative for blurred vision and double vision.  Respiratory: Negative for cough, shortness of breath and wheezing.   Cardiovascular: Negative for chest pain, palpitations, orthopnea, claudication and leg swelling.  Gastrointestinal: Positive for diarrhea (Intermittent, associated with trigger foods). Negative for abdominal pain, blood in stool, constipation, heartburn, melena, nausea and vomiting.  Genitourinary: Negative.   Musculoskeletal: Negative for joint pain and myalgias.  Skin: Negative for rash.  Neurological: Positive for headaches (Frequent sinus headaches; rare migraines). Negative for dizziness, tingling, sensory change and weakness.  Endo/Heme/Allergies: Positive for environmental allergies. Negative for polydipsia.  Psychiatric/Behavioral: Negative.  Negative for depression and substance abuse. The patient is not nervous/anxious and does not have insomnia.   All other systems reviewed and are negative.   Physical Exam: Estimated body mass index is 29.09 kg/m as calculated from the following:   Height as of 10/13/18: 5\' 3"  (1.6 m).   Weight as of 10/13/18: 164 lb 3.2 oz (74.5 kg). There were no vitals taken for this visit. General Appearance: Well nourished, in no apparent distress.  Eyes: PERRLA, EOMs, conjunctiva no swelling or erythema, normal fundi and vessels.  Sinuses: No Frontal/maxillary tenderness  ENT/Mouth: Ext aud canals clear, normal light reflex  with TMs without erythema, bulging. Good dentition. No erythema, swelling, or exudate on post pharynx. Tonsils not swollen or erythematous. Hearing normal.  Neck: Supple, thyroid normal. No bruits  Respiratory: Respiratory effort normal, BS equal bilaterally without rales, rhonchi, wheezing or stridor.  Cardio: RRR without murmurs, rubs or gallops. Brisk peripheral pulses without edema.  Chest: symmetric, with normal excursions and percussion.  Breasts: Symmetric, without lumps, nipple discharge, retractions.  Abdomen: Soft, nontender, no guarding, rebound, hernias, masses, or organomegaly.  Lymphatics: Non tender without lymphadenopathy.  Genitourinary: Defered; s/p total hysterectomy Musculoskeletal: Full ROM all peripheral extremities,5/5 strength, and normal gait.  Skin: Warm, dry without rashes, lesions, ecchymosis. Neuro: Cranial nerves intact, reflexes equal bilaterally. Normal muscle tone, no cerebellar symptoms. Sensation intact.  Psych: Awake and oriented X 3, normal affect, Insight and Judgment appropriate.   EKG: Stable from previous - no ST changes   Izora Ribas 7:58 AM [facility]  Adult & Adolescent Internal Medicine  

## 2019-10-16 ENCOUNTER — Encounter: Payer: Self-pay | Admitting: Adult Health

## 2019-10-19 ENCOUNTER — Other Ambulatory Visit: Payer: Self-pay | Admitting: Adult Health

## 2019-10-19 DIAGNOSIS — G43909 Migraine, unspecified, not intractable, without status migrainosus: Secondary | ICD-10-CM

## 2019-10-19 MED ORDER — ZEMBRACE SYMTOUCH 3 MG/0.5ML ~~LOC~~ SOAJ: 1.0000 "pen " | SUBCUTANEOUS | 3 refills | Status: DC | PRN

## 2019-10-31 DIAGNOSIS — Z860100 Personal history of colon polyps, unspecified: Secondary | ICD-10-CM | POA: Insufficient documentation

## 2019-10-31 NOTE — Progress Notes (Signed)
Complete Physical  Assessment and Plan:  Diagnoses and all orders for this visit:  Encounter for general adult medical examination with abnormal findings Continue annual mammogram   Migraine without status migrainosus, not intractable, unspecified migraine type Well managed by current regimen; topamax daily as prophylaxis with injectable triptan - reports 3-4 annually. She reports ongoing issues with sinus type headaches managed by daily allergy medication, Flonase. Discussed ENT; patient declines.   Gastroesophageal reflux disease, esophagitis presence not specified Well managed with avoidance of triggers Discussed diet, avoiding triggers and other lifestyle changes  Irritable bowel syndrome, with diarrhea Patient reports well managed by avoidance of triggers (fastfoods, etc) Uses metamucil daily which has reportedly improved symptoms; discussed imodium as needed  Mixed hyperlipidemia At goal with lifestyle and omega 3 supplementation Continue low cholesterol diet and exercise.  -     Lipid panel -     TSH  Overweight (BMI 25.0-29.9) Long discussion about weight loss, diet, and exercise Recommended diet heavy in fruits and veggies and low in animal meats, cheeses, and dairy products, appropriate calorie intake Discussed appropriate weight for height  Follow up at next visit  Screening for cardiovascular condition/Hx of abnormal EKG History of abnormal EKG; stable Has seen cardiology with normal ECHO 2012 Monitor  -     EKG 12-Lead  Screening for diabetes mellitus -     Hemoglobin A1c  Screening for hematuria or proteinuria -     Urinalysis, Complete (81001)  Vitamin D deficiency -     VITAMIN D 25 Hydroxy (Vit-D Deficiency, Fractures)  Medication management -     CBC with Differential/Platelet -     CMPGFR  Bilateral knee pain Was established with Dr. Rhina Brackett With [facility] ortho, for knee pain/effusions and was receiving steroid injections.  Call  insurance about coverage/cost issues; NSAIDs have not been very effective, injections helpful but last visit was costly at $600+ out of pocket, she will work with insurance to find in network provider  Osteopenia Long history of osteopenia - have been tracking routinely q2-5 years by DEXA - last early 2019 Recommended weight bearing exercises, continue vitamin D/Calcium supplementation  Discussed med's effects and SE's. Screening labs and tests as requested with regular follow-up as recommended. Over 40 minutes of exam, counseling, chart review, and complex, high level critical decision making was performed this visit.   Future Appointments  Date Time Provider [place]  11/03/2020 11:15 AM [name], NP [facility] None     HPI  53 y.o. female  presents for a complete physical and follow up for has Hyperlipidemia; Migraine headache; GERD; IBS; Abnormal EKG; Overweight (BMI 25.0-29.9); Osteopenia; and Personal history of colonic polyps on their problem list.   She is married, 2 kids, 4 grand kids.   She had covid 19 this year 09/29/2019 - still has mild residual cough that continues to improve.   She reports ongoing regular headaches - she describes most are "sinus headaches" - improved by daily allergy medication, regular use of flonase. She has declined ENT evaluation. She reports 3-4 migraines a year for which she is prescribed topamax 50 mg as prophylaxis and injectable triptan as abortive; she reports these medications have been very effective for her.   She has bilateral knee/foot pain, sees Dr. Rip Harbour at [place], getting injections in knees and possible plantal fascitis but none recently due to $600 copay, looking with insurance for new provider. She takes mobic, ibuprofen but feels benefit has been limited.   BMI is  Body mass index is 29.48 kg/m., she has been working on diet, exercise limited by ortho conditions. She reports increased knee pain and has not been  able to walk, planning on getting bicycles with husband. She reports water intake - 90+ fluid ounces. Sleeps 7 hours nightly.  Wt Readings from Last 3 Encounters:  11/01/19 166 lb 6.4 oz (75.5 kg)  10/13/18 164 lb 3.2 oz (74.5 kg)  10/13/17 177 lb (80.3 kg)   Today their BP is   She does not workout. She denies chest pain, shortness of breath, dizziness.   She is not on cholesterol medication and denies myalgias. Her cholesterol is at goal. The cholesterol last visit was:   Lab Results  Component Value Date   CHOL 173 10/13/2018   HDL 56 10/13/2018   LDLCALC 98 10/13/2018   TRIG 93 10/13/2018   CHOLHDL 3.1 10/13/2018    Last A1C in the office was:  Lab Results  Component Value Date   HGBA1C 5.1 10/13/2018   Last GFR: Lab Results  Component Value Date   GFRNONAA 90 10/13/2018   Patient is on Vitamin D supplement and at goal:    Lab Results  Component Value Date   VD25OH 73 10/13/2018       Current Medications:  Current Outpatient Medications on File Prior to Visit  Medication Sig Dispense Refill  . Ascorbic Acid (VITAMIN C PO) Take 1,000 mg by mouth daily.     . benzonatate (TESSALON) 200 MG capsule Take 1 perle 3 x / day to prevent cough 30 capsule 1  . Calcium Carb-Cholecalciferol (CALCIUM + D3) 600-200 MG-UNIT TABS Take 1,200 Units by mouth.    . Cholecalciferol (VITAMIN D3) 2000 units TABS Take 4,000 Units by mouth.     Marland Kitchen MAGNESIUM PO Take 250 mg by mouth daily.     . meloxicam (MOBIC) 15 MG tablet Take 1 tablet (15 mg total) by mouth daily. (Patient taking differently: Take 15 mg by mouth as needed. ) 90 tablet 0  . psyllium (METAMUCIL) 58.6 % powder Take 1 packet by mouth daily.    Marland Kitchen topiramate (TOPAMAX) 50 MG tablet TAKE 1 TABLET BY MOUTH TWICE A DAY 180 tablet 3  . ZEMBRACE SYMTOUCH 3 MG/0.5ML SOAJ Inject 1 pen into the skin as needed (Migraine). 3 pen 3  . zinc gluconate 50 MG tablet Take 50 mg by mouth daily.    . fish oil-omega-3 fatty acids 1000 MG capsule  Take 1 g by mouth daily.     . Probiotic Product (PROBIOTIC PO) Take 2 tablets by mouth daily.      No current facility-administered medications on file prior to visit.   Allergies:  Allergies  Allergen Reactions  . Oxycodone Other (See Comments)    Hallucinations  . Codeine    Medical History:  She has Hyperlipidemia; Migraine headache; GERD; IBS; Abnormal EKG; Overweight (BMI 25.0-29.9); Osteopenia; and Personal history of colonic polyps on their problem list. Health Maintenance:   Immunization History  Administered Date(s) Administered  . Influenza Inj Mdck Quad With Preservative 10/13/2017, 08/28/2018, 08/21/2019  . Influenza Split 08/27/2014   Tetanus: 2014 Pneumovax: n/a Prevnar 13: n/a Flu vaccine: 08/2019 Shingrix: Declines  Pap: s/p hysterectomy MGM: 05/2019 DEXA: 2019 - T -1.6 -has been stable since dx, defer to 2022  ECHO: 2012 -no significant abnormalities, EF 55-60%  Colonoscopy: 2017 due q5 colon polyp - [facility]  EGD: n/a  Last Dental Exam: Dr. Juleen China & [facility], last 2020, goes q48m Last  Eye Exam: 2020, wears glasses  Last Derm: Dr. 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 Family History:  Herfamily history includes Arthritis in her father; Cancer in her father; Colitis in her brother; Diabetes in her father and mother; Heart attack in her maternal grandmother; Heart disease in her paternal grandfather; Heart disease (age of onset: 11) in her father; Hypertension in her mother; Kidney  disease in her father; Migraines in her paternal grandmother; Miscarriages / [place] in her mother; Other in her paternal grandmother; Pancreatic cancer in her paternal grandfather; Stroke in her father. Social History:  She reports that she quit smoking about 13 years ago. Her smoking use included cigarettes. She has a 10.00 pack-year smoking history. She has never used smokeless tobacco. She reports current alcohol use. She reports that she does not use drugs.  Review of Systems: Review of Systems  Constitutional: Negative for malaise/fatigue and weight loss.  HENT: Negative for hearing loss and tinnitus.   Eyes: Negative for blurred vision and double vision.  Respiratory: Negative for cough, shortness of breath and wheezing.   Cardiovascular: Negative for chest pain, palpitations, orthopnea, claudication and leg swelling.  Gastrointestinal: Positive for diarrhea (Intermittent, associated with trigger foods). Negative for abdominal pain, blood in stool, constipation, heartburn, melena, nausea and vomiting.  Genitourinary: Negative.   Musculoskeletal: Negative for joint pain and myalgias.  Skin: Negative for rash.  Neurological: Positive for headaches (Frequent sinus headaches; rare migraines). Negative for dizziness, tingling, sensory change and weakness.  Endo/Heme/Allergies: Positive for environmental allergies. Negative for polydipsia.  Psychiatric/Behavioral: Negative.  Negative for depression and substance abuse. The patient is not nervous/anxious and does not have insomnia.   All other systems reviewed and are negative.   Physical Exam: Estimated body mass index is 29.48 kg/m as calculated from the following:   Height as of 10/13/18: 5\' 3"  (1.6 m).   Weight as of this encounter: 166 lb 6.4 oz (75.5 kg). Wt 166 lb 6.4 oz (75.5 kg)   BMI 29.48 kg/m  General Appearance: Well nourished, in no apparent distress.  Eyes: PERRLA, EOMs, conjunctiva no swelling or erythema Sinuses:  No Frontal/maxillary tenderness  ENT/Mouth: Ext aud canals clear, normal light reflex with TMs without erythema, bulging. Good dentition. No erythema, swelling, or exudate on post pharynx. Tonsils not swollen or erythematous. Hearing normal.  Neck: Supple, thyroid normal. No bruits  Respiratory: Respiratory effort normal, BS equal bilaterally without rales, rhonchi, wheezing or stridor.  Cardio: RRR without murmurs, rubs or gallops. Brisk peripheral pulses without edema.  Chest: symmetric, with normal excursions and percussion.  Breasts: Symmetric, without lumps, nipple discharge, retractions.  Abdomen: Soft, nontender, no guarding, rebound, hernias, masses, or organomegaly.  Lymphatics: Non tender without lymphadenopathy.  Genitourinary: Defered; no concerns; s/p total hysterectomy Musculoskeletal: Full ROM all peripheral extremities,5/5 strength, and normal gait.  Skin: Warm, dry without rashes, lesions, ecchymosis. Neuro: Cranial nerves intact, reflexes equal bilaterally. Normal muscle tone, no cerebellar symptoms. Sensation intact.  Psych: Awake and oriented X 3, normal affect, Insight and Judgment appropriate.   EKG: Stable from previous - no ST changes  Caryl Pina  C Ziyonna Christner 1:36 PM [facility]

## 2019-11-01 ENCOUNTER — Ambulatory Visit (INDEPENDENT_AMBULATORY_CARE_PROVIDER_SITE_OTHER): Payer: Managed Care, Other (non HMO) | Admitting: Adult Health

## 2019-11-01 ENCOUNTER — Encounter: Payer: Self-pay | Admitting: Adult Health

## 2019-11-01 ENCOUNTER — Other Ambulatory Visit: Payer: Self-pay

## 2019-11-01 VITALS — BP 126/82 | HR 78 | Temp 97.3°F | Ht 63.0 in | Wt 166.4 lb

## 2019-11-01 DIAGNOSIS — Z1329 Encounter for screening for other suspected endocrine disorder: Secondary | ICD-10-CM

## 2019-11-01 DIAGNOSIS — E663 Overweight: Secondary | ICD-10-CM

## 2019-11-01 DIAGNOSIS — R030 Elevated blood-pressure reading, without diagnosis of hypertension: Secondary | ICD-10-CM

## 2019-11-01 DIAGNOSIS — R9431 Abnormal electrocardiogram [ECG] [EKG]: Secondary | ICD-10-CM

## 2019-11-01 DIAGNOSIS — Z0000 Encounter for general adult medical examination without abnormal findings: Secondary | ICD-10-CM | POA: Diagnosis not present

## 2019-11-01 DIAGNOSIS — G43909 Migraine, unspecified, not intractable, without status migrainosus: Secondary | ICD-10-CM

## 2019-11-01 DIAGNOSIS — Z86010 Personal history of colonic polyps: Secondary | ICD-10-CM

## 2019-11-01 DIAGNOSIS — M8588 Other specified disorders of bone density and structure, other site: Secondary | ICD-10-CM

## 2019-11-01 DIAGNOSIS — Z136 Encounter for screening for cardiovascular disorders: Secondary | ICD-10-CM

## 2019-11-01 DIAGNOSIS — E782 Mixed hyperlipidemia: Secondary | ICD-10-CM

## 2019-11-01 DIAGNOSIS — E559 Vitamin D deficiency, unspecified: Secondary | ICD-10-CM

## 2019-11-01 DIAGNOSIS — K580 Irritable bowel syndrome with diarrhea: Secondary | ICD-10-CM

## 2019-11-01 DIAGNOSIS — Z131 Encounter for screening for diabetes mellitus: Secondary | ICD-10-CM

## 2019-11-01 DIAGNOSIS — Z85828 Personal history of other malignant neoplasm of skin: Secondary | ICD-10-CM | POA: Insufficient documentation

## 2019-11-01 DIAGNOSIS — K219 Gastro-esophageal reflux disease without esophagitis: Secondary | ICD-10-CM

## 2019-11-01 DIAGNOSIS — R7309 Other abnormal glucose: Secondary | ICD-10-CM

## 2019-11-01 NOTE — Patient Instructions (Addendum)
Kelly Mclaughlin , Thank you for taking time to come for your Medicare Wellness Visit. I appreciate your ongoing commitment to your health goals. Please review the following plan we discussed and let me know if I can assist you in the future.   These are the goals we discussed: Goals    . Weight (lb) < 155 lb (70.3 kg)       This is a list of the screening recommended for you and due dates:  Health Maintenance  Topic Date Due  . HIV Screening  11/06/1980  . Colon Cancer Screening  01/29/2021  . Mammogram  05/24/2021  . Tetanus Vaccine  04/12/2023  . Flu Shot  Completed    Ask insurance about shingrix      Know what a healthy weight is for you (roughly BMI <25) and aim to maintain this   Drink 1/2 your body weight in fluid ounces of water daily; drink a tall glass of water 30 min before meals  Don't eat until you're stuffed- listen to your stomach and eat until you are 80% full   Try eating off of a salad plate; wait 10 min after finishing before going back for seconds  Start by eating the vegetables on your plate; aim for 50% of your meals to be fruits or vegetables  Then eat your protein - lean meats (grass fed if possible), fish, beans, nuts in moderation  Eat your carbs/starch last ONLY if you still are hungry. If you can, stop before finishing it all  Avoid sugar and flour - the closer it looks to it's original form in nature, typically the better it is for you  Splurge in moderation - "assign" days when you get to splurge and have the "bad stuff" - I like to follow a 80% - 20% plan- "good" choices 80 % of the time, "bad" choices in moderation 20% of the time  Simple equation is: Calories out > calories in = weight loss - even if you eat the bad stuff, if you limit portions, you will still lose weight   Aim for 7+ servings of fruits and vegetables daily  65-80+ fluid ounces of water or unsweet tea for healthy kidneys  Limit to max 1 drink of alcohol per day; avoid  smoking/tobacco  Limit animal fats in diet for cholesterol and heart health - choose grass fed whenever available  Avoid highly processed foods, and foods high in saturated/trans fats  Aim for low stress - take time to unwind and care for your mental health  Aim for 150 min of moderate intensity exercise weekly for heart health, and weights twice weekly for bone health  Aim for 7-9 hours of sleep daily       Chronic Knee Pain, Adult Knee pain that lasts longer than 3 months is called chronic knee pain. You may have pain in one or both knees. Symptoms of chronic knee pain may also include swelling and stiffness. The most common cause is age-related wear and tear (osteoarthritis) of your knee joint. Many conditions can cause chronic knee pain. Treatment depends on the cause. The main treatments are physical therapy and weight loss. It may also be treated with medicines, injections, a knee sleeve or brace, and by using crutches. Rest, ice, compression (pressure), and elevation (RICE) therapy may also be recommended. Follow these instructions at home: If you have a knee sleeve or brace:   Wear it as told by your doctor. Remove it only as told by your  doctor.  Loosen it if your toes: ? Tingle. ? Become numb. ? Turn cold and blue.  Keep it clean.  If the sleeve or brace is not waterproof: ? Do not let it get wet. ? Remove it if told by your doctor, or cover it with a watertight covering when you take a bath or shower. Managing pain, stiffness, and swelling      If told, put heat on your knee. Do this as often as told by your doctor. Use the heat source that your doctor recommends, such as a moist heat pack or a heating pad. ? If you have a removable sleeve or brace, remove it as told by your doctor. ? Place a towel between your skin and the heat source. ? Leave the heat on for 20-30 minutes. ? Remove the heat if your skin turns bright red. This is very important if you are  unable to feel pain, heat, or cold. You may have a greater risk of getting burned.  If told, put ice on your knee. ? If you have a removable sleeve or brace, remove it as told by your doctor. ? Put ice in a plastic bag. ? Place a towel between your skin and the bag. ? Leave the ice on for 20 minutes, 2-3 times a day.  Move your toes often.  Raise (elevate) the injured area above the level of your heart while you are sitting or lying down. Activity  Avoid activities where both feet leave the ground at the same time (high-impact activities). Examples are running, jumping rope, and doing jumping jacks.  Return to your normal activities as told by your doctor. Ask your doctor what activities are safe for you.  Follow the exercise plan that your doctor makes for you. Your doctor may suggest that you: ? Avoid activities that make knee pain worse. You may need to change the exercises that you do, the sports that you participate in, or your job duties. ? Wear shoes with cushioned soles. ? Avoid high-impact activities or sports that require running and sudden changes in direction. ? Do exercises or physical therapy as told by your doctor. Physical therapy is planned to match your needs and abilities. ? Do exercises that increase your balance and strength, such as tai chi and yoga.  Do not use your injured knee to support your body weight until your doctor says that you can. Use crutches, a cane, or a walker, as told by your doctor. General instructions  Take over-the-counter and prescription medicines only as told by your doctor.  If you are overweight, work with your doctor and a food expert (dietitian) to set goals to lose weight. Being overweight can make your knee hurt more.  Do not use any products that contain nicotine or tobacco, such as cigarettes, e-cigarettes, and chewing tobacco. If you need help quitting, ask your doctor.  Keep all follow-up visits as told by your doctor. This is  important. Contact a doctor if:  You have knee pain that is not getting better or gets worse.  You are not able to do your exercises due to knee pain. Get help right away if:  Your knee swells and the swelling becomes worse.  You cannot move your knee.  You have very bad knee pain. Summary  Knee pain that lasts more than 3 months is considered chronic knee pain.  The main treatments for chronic knee pain are physical therapy and weight loss. You may also need to take  medicines, wear a knee sleeve or brace, use crutches, and put ice or heat on your knee.  Lose weight if you are overweight. Work with your doctor and a food expert (dietitian) to help you set goals to lose weight. Being overweight can make your knee hurt more.  Work with a physical therapist to make a safe exercise program, as told by your doctor. This information is not intended to replace advice given to you by your health care provider. Make sure you discuss any questions you have with your health care provider. Document Revised: 12/28/2018 Document Reviewed: 12/28/2018 Elsevier Patient Education  [place].

## 2019-11-02 LAB — COMPLETE METABOLIC PANEL WITH GFR
AG Ratio: 1.8 (calc) (ref 1.0–2.5)
ALT: 29 U/L (ref 6–29)
AST: 18 U/L (ref 10–35)
Albumin: 4.4 g/dL (ref 3.6–5.1)
Alkaline phosphatase (APISO): 78 U/L (ref 37–153)
BUN: 13 mg/dL (ref 7–25)
CO2: 24 mmol/L (ref 20–32)
Calcium: 9.8 mg/dL (ref 8.6–10.4)
Chloride: 109 mmol/L (ref 98–110)
Creat: 0.63 mg/dL (ref 0.50–1.05)
GFR, Est African American: 119 mL/min/{1.73_m2} (ref >=60)
GFR, Est Non African American: 102 mL/min/{1.73_m2} (ref >=60)
Globulin: 2.4 g/dL (calc) (ref 1.9–3.7)
Glucose, Bld: 81 mg/dL (ref 65–99)
Potassium: 4.3 mmol/L (ref 3.5–5.3)
Sodium: 141 mmol/L (ref 135–146)
Total Bilirubin: 0.3 mg/dL (ref 0.2–1.2)
Total Protein: 6.8 g/dL (ref 6.1–8.1)

## 2019-11-02 LAB — LIPID PANEL
Cholesterol: 211 mg/dL — ABNORMAL HIGH (ref <200)
HDL: 52 mg/dL (ref >=50)
LDL Cholesterol (Calc): 134 mg/dL (calc) — ABNORMAL HIGH
Non-HDL Cholesterol (Calc): 159 mg/dL (calc) — ABNORMAL HIGH (ref <130)
Total CHOL/HDL Ratio: 4.1 (calc) (ref <5.0)
Triglycerides: 133 mg/dL (ref <150)

## 2019-11-02 LAB — CBC WITH DIFFERENTIAL/PLATELET
Absolute Monocytes: 380 cells/uL (ref 200–950)
Basophils Absolute: 28 cells/uL (ref 0–200)
Basophils Relative: 0.5 %
Eosinophils Absolute: 149 cells/uL (ref 15–500)
Eosinophils Relative: 2.7 %
HCT: 36.5 % (ref 35.0–45.0)
Hemoglobin: 12.3 g/dL (ref 11.7–15.5)
Lymphs Abs: 2068 cells/uL (ref 850–3900)
MCH: 29.7 pg (ref 27.0–33.0)
MCHC: 33.7 g/dL (ref 32.0–36.0)
MCV: 88.2 fL (ref 80.0–100.0)
MPV: 10.5 fL (ref 7.5–12.5)
Monocytes Relative: 6.9 %
Neutro Abs: 2877 cells/uL (ref 1500–7800)
Neutrophils Relative %: 52.3 %
Platelets: 214 10*3/uL (ref 140–400)
RBC: 4.14 10*6/uL (ref 3.80–5.10)
RDW: 13.2 % (ref 11.0–15.0)
Total Lymphocyte: 37.6 %
WBC: 5.5 10*3/uL (ref 3.8–10.8)

## 2019-11-02 LAB — VITAMIN D 25 HYDROXY (VIT D DEFICIENCY, FRACTURES): Vit D, 25-Hydroxy: 110 ng/mL — ABNORMAL HIGH (ref 30–100)

## 2019-11-02 LAB — URINALYSIS, ROUTINE W REFLEX MICROSCOPIC
Bilirubin Urine: NEGATIVE
Glucose, UA: NEGATIVE
Hgb urine dipstick: NEGATIVE
Ketones, ur: NEGATIVE
Leukocytes,Ua: NEGATIVE
Nitrite: NEGATIVE
Protein, ur: NEGATIVE
Specific Gravity, Urine: 1.004 (ref 1.001–1.03)
pH: 7 (ref 5.0–8.0)

## 2019-11-02 LAB — HEMOGLOBIN A1C
Hgb A1c MFr Bld: 5.5 % of total Hgb (ref <5.7)
Mean Plasma Glucose: 111 (calc)
eAG (mmol/L): 6.2 (calc)

## 2019-11-02 LAB — MAGNESIUM: Magnesium: 2.1 mg/dL (ref 1.5–2.5)

## 2019-11-02 LAB — TSH: TSH: 0.8 mIU/L

## 2019-11-20 ENCOUNTER — Other Ambulatory Visit: Payer: Self-pay | Admitting: Internal Medicine

## 2020-02-04 ENCOUNTER — Other Ambulatory Visit: Payer: Self-pay | Admitting: Adult Health

## 2020-02-04 DIAGNOSIS — Z1231 Encounter for screening mammogram for malignant neoplasm of breast: Secondary | ICD-10-CM

## 2020-02-07 DIAGNOSIS — M170 Bilateral primary osteoarthritis of knee: Secondary | ICD-10-CM | POA: Insufficient documentation

## 2020-02-07 NOTE — Progress Notes (Deleted)
FOLLOW UP  Assessment and Plan:   Cholesterol Currently above goal; working on lifestyle Initiate statin if LDL remains 130+ Continue low cholesterol diet and exercise.  Check lipid panel.   Migraine without status migrainosus, not intractable, unspecified migraine type Well managed by current regimen; topamax daily as prophylaxis with injectable triptan - reports 3-4 annually. She reports ongoing issues with sinus type headaches managed by daily allergy medication, Flonase.   Overweight  Long discussion about weight loss, diet, and exercise Recommended diet heavy in fruits and veggies and low in animal meats, cheeses, and dairy products, appropriate calorie intake Discussed ideal weight for height (below ***) and initial weight goal (***) Patient will work on *** Will follow up in 3 months  Vitamin D Def Above goal at last visit; *** continue supplementation to maintain goal of 60-100 Defer Vit D level  Knee arthritis Followed by [facility]; ***  Continue diet and meds as discussed. Further disposition pending results of labs. Discussed med's effects and SE's.   Over 30 minutes of exam, counseling, chart review, and critical decision making was performed.   Future Appointments  Date Time Provider Startup  02/13/2020 11:00 AM Kelly Comber, NP [facility] None  05/30/2020  2:00 PM [facility] 2 [facility]  11/03/2020 11:15 AM Kelly Comber, NP [facility] None    ----------------------------------------------------------------------------------------------------------------------  HPI 54 y.o. female  presents for 3 month follow up on migraines, cholesterol, glucose, weight and vitamin D deficiency.   She reports ongoing regular headaches - she describes most are "sinus headaches" - improved by daily allergy medication, regular use of flonase. She has declined ENT evaluation. She reports 3-4 migraines a year for which she is prescribed topamax 50 mg as  prophylaxis and injectable triptan as abortive; she reports these medications have been very effective for her.   She has bilateral knee/foot pain, sees Kelly Mclaughlin at [place], getting synvisc injections in knees, mod/severe tricompartmental arthritis on R, ***  BMI is There is no height or weight on file to calculate BMI., she {HAS HAS [id number] been working on diet and exercise. Wt Readings from Last 3 Encounters:  11/01/19 166 lb 6.4 oz (75.5 kg)  10/13/18 164 lb 3.2 oz (74.5 kg)  10/13/17 177 lb (80.3 kg)    Her blood pressure {HAS HAS NOT:18834} been controlled at home, today their BP is    She {DOES_DOES [id number] workout. She denies chest pain, shortness of breath, dizziness.   She is not on cholesterol medication. Her cholesterol is not at goal. The cholesterol last visit was:   Lab Results  Component Value Date   CHOL 211 (H) 11/01/2019   HDL 52 11/01/2019   LDLCALC 134 (H) 11/01/2019   TRIG 133 11/01/2019   CHOLHDL 4.1 11/01/2019    She {Has/has not:18111} been working on diet and exercise for glucose management, and denies nausea, polydipsia, polyuria, visual disturbances, vomiting and weight loss. Last A1C in the office was:  Lab Results  Component Value Date   HGBA1C 5.5 11/01/2019   Patient is on Vitamin D supplement.   Lab Results  Component Value Date   VD25OH 110 (H) 11/01/2019        Current Medications:  Current Outpatient Medications on File Prior to Visit  Medication Sig  . Ascorbic Acid (VITAMIN C PO) Take 1,000 mg by mouth daily.   . benzonatate (TESSALON) 200 MG capsule Take 1 perle 3 x / day to prevent cough  . Calcium Carb-Cholecalciferol (CALCIUM +  D3) 600-200 MG-UNIT TABS Take 1,200 Units by mouth.  . Cholecalciferol (VITAMIN D3) 2000 units TABS Take 4,000 Units by mouth.   . fish oil-omega-3 fatty acids 1000 MG capsule Take 1 g by mouth daily.   Marland Kitchen MAGNESIUM PO Take 250 mg by mouth daily.   . meloxicam (MOBIC) 15 MG tablet Take 1  tablet (15 mg total) by mouth daily. (Patient taking differently: Take 15 mg by mouth as needed. )  . Probiotic Product (PROBIOTIC PO) Take 2 tablets by mouth daily.   . psyllium (METAMUCIL) 58.6 % powder Take 1 packet by mouth daily.  Marland Kitchen topiramate (TOPAMAX) 50 MG tablet Take 1 tablet 2 x /day at Suppertime & Bedtime for Dieting & Weight Loss  . ZEMBRACE SYMTOUCH 3 MG/0.5ML SOAJ Inject 1 pen into the skin as needed (Migraine).  . zinc gluconate 50 MG tablet Take 50 mg by mouth daily.   No current facility-administered medications on file prior to visit.     Allergies:  Allergies  Allergen Reactions  . Oxycodone Other (See Comments)    Hallucinations  . Codeine      Medical History:  Past Medical History:  Diagnosis Date  . Abnormal EKG 09/03/2011  . Allergy   . Breast lump on left side at 9 o'clock position 10/12/2017   Noted on 04/2016 screening MMG - Stable on 6 and 12 month f/u diagnostic mammograms and by ultrasound; likely benign; no further workup recommended  . Cancer ([place])    basal cell on temple  . GERD (gastroesophageal reflux disease)   . IBS (irritable bowel syndrome)   . Migraine   . Osteoporosis    osteopenia   Family history- Reviewed and unchanged Social history- Reviewed and unchanged   Review of Systems:  Review of Systems  Constitutional: Negative for malaise/fatigue and weight loss.  HENT: Negative for hearing loss and tinnitus.   Eyes: Negative for blurred vision and double vision.  Respiratory: Negative for cough, shortness of breath and wheezing.   Cardiovascular: Negative for chest pain, palpitations, orthopnea, claudication and leg swelling.  Gastrointestinal: Negative for abdominal pain, blood in stool, constipation, diarrhea, heartburn, melena, nausea and vomiting.  Genitourinary: Negative.   Musculoskeletal: Positive for joint pain (bil knees). Negative for myalgias.  Skin: Negative for rash.  Neurological: Negative for dizziness, tingling,  sensory change, weakness and headaches.  Endo/Heme/Allergies: Negative for polydipsia.  Psychiatric/Behavioral: Negative.   All other systems reviewed and are negative.     Physical Exam: There were no vitals taken for this visit. Wt Readings from Last 3 Encounters:  11/01/19 166 lb 6.4 oz (75.5 kg)  10/13/18 164 lb 3.2 oz (74.5 kg)  10/13/17 177 lb (80.3 kg)   General Appearance: Well nourished, in no apparent distress. Eyes: PERRLA, EOMs, conjunctiva no swelling or erythema Sinuses: No Frontal/maxillary tenderness ENT/Mouth: Ext aud canals clear, TMs without erythema, bulging. No erythema, swelling, or exudate on post pharynx.  Tonsils not swollen or erythematous. Hearing normal.  Neck: Supple, thyroid normal.  Respiratory: Respiratory effort normal, BS equal bilaterally without rales, rhonchi, wheezing or stridor.  Cardio: RRR with no MRGs. Brisk peripheral pulses without edema.  Abdomen: Soft, + BS.  Non tender, no guarding, rebound, hernias, masses. Lymphatics: Non tender without lymphadenopathy.  Musculoskeletal: Full ROM, 5/5 strength, {PSY - GAIT AND STATION:22860} gait Skin: Warm, dry without rashes, lesions, ecchymosis.  Neuro: Cranial nerves intact. No cerebellar symptoms.  Psych: Awake and oriented X 3, normal affect, Insight and Judgment appropriate.  Kelly Ribas, NP 3:53 PM [facility]

## 2020-02-13 ENCOUNTER — Ambulatory Visit: Payer: Managed Care, Other (non HMO) | Admitting: Adult Health

## 2020-05-01 ENCOUNTER — Other Ambulatory Visit: Payer: Self-pay | Admitting: Internal Medicine

## 2020-05-01 MED ORDER — PREDNISONE 20 MG PO TABS: ORAL_TABLET | ORAL | 1 refills | Status: DC

## 2020-05-20 ENCOUNTER — Telehealth (INDEPENDENT_AMBULATORY_CARE_PROVIDER_SITE_OTHER): Payer: 59 | Admitting: Physician Assistant

## 2020-05-20 DIAGNOSIS — Z6831 Body mass index (BMI) 31.0-31.9, adult: Secondary | ICD-10-CM

## 2020-05-20 DIAGNOSIS — E782 Mixed hyperlipidemia: Secondary | ICD-10-CM | POA: Diagnosis not present

## 2020-05-20 MED ORDER — WEGOVY 0.5 MG/0.5ML ~~LOC~~ SOAJ: 0.5000 mg | SUBCUTANEOUS | 0 refills | Status: DC

## 2020-05-20 MED ORDER — WEGOVY 0.25 MG/0.5ML ~~LOC~~ SOAJ: 0.2500 mg | SUBCUTANEOUS | 0 refills | Status: DC

## 2020-05-20 NOTE — Telephone Encounter (Signed)
Patient's BMI is 31.3 with weight of 175 lb and 5'2".  with history of co morbid conditions including hyperlipidemia presents for [facility] initiation. She has tried several medications for weight loss including phentermine, topamax, vyvanse, weight watchers, exercise without success.   has Hyperlipidemia; Migraine headache; GERD; IBS; Abnormal EKG; Overweight (BMI 25.0-29.9); Osteopenia; Personal history of colonic polyps; History of basal cell cancer; and Osteoarthritis of both knees on their problem list.  Diagnoses and all orders for this visit:  BMI 31.0-31.9,adult  Mixed hyperlipidemia  Other orders -     Semaglutide-Weight Management (WEGOVY) 0.25 MG/0.5ML SOAJ; Inject 0.25 mg into the skin once a week. -     Semaglutide-Weight Management (WEGOVY) 0.5 MG/0.5ML SOAJ; Inject 0.5 mg into the skin once a week.

## 2020-05-30 ENCOUNTER — Other Ambulatory Visit: Payer: Self-pay

## 2020-05-30 ENCOUNTER — Ambulatory Visit
Admission: RE | Admit: 2020-05-30 | Discharge: 2020-05-30 | Disposition: A | Discharge status: Home / Self Care | Payer: 59 | Source: Ambulatory Visit | Attending: Adult Health | Admitting: Adult Health

## 2020-05-30 DIAGNOSIS — Z1231 Encounter for screening mammogram for malignant neoplasm of breast: Secondary | ICD-10-CM

## 2020-06-17 ENCOUNTER — Other Ambulatory Visit: Payer: Self-pay | Admitting: Adult Health Nurse Practitioner

## 2020-06-17 DIAGNOSIS — B0223 Postherpetic polyneuropathy: Secondary | ICD-10-CM

## 2020-06-17 MED ORDER — VALACYCLOVIR HCL 1 G PO TABS: 1000.0000 mg | ORAL_TABLET | Freq: Three times a day (TID) | ORAL | 0 refills | Status: DC

## 2020-06-18 ENCOUNTER — Other Ambulatory Visit: Payer: Self-pay | Admitting: Internal Medicine

## 2020-06-18 DIAGNOSIS — B0223 Postherpetic polyneuropathy: Secondary | ICD-10-CM

## 2020-06-18 DIAGNOSIS — B0222 Postherpetic trigeminal neuralgia: Secondary | ICD-10-CM

## 2020-06-18 MED ORDER — PREGABALIN 50 MG PO CAPS: ORAL_CAPSULE | ORAL | 0 refills | Status: DC

## 2020-06-26 ENCOUNTER — Other Ambulatory Visit: Payer: Self-pay | Admitting: Internal Medicine

## 2020-06-30 ENCOUNTER — Other Ambulatory Visit: Payer: Self-pay | Admitting: Physician Assistant

## 2020-06-30 MED ORDER — WEGOVY 1 MG/0.5ML ~~LOC~~ SOAJ: 1.0000 mg | SUBCUTANEOUS | 0 refills | Status: DC

## 2020-06-30 MED ORDER — WEGOVY 1.7 MG/0.75ML ~~LOC~~ SOAJ: 1.7000 mg | SUBCUTANEOUS | 0 refills | Status: DC

## 2020-07-31 ENCOUNTER — Other Ambulatory Visit: Payer: Self-pay | Admitting: Physician Assistant

## 2020-07-31 MED ORDER — ONDANSETRON HCL 4 MG PO TABS: 4.0000 mg | ORAL_TABLET | Freq: Every day | ORAL | 1 refills | Status: AC | PRN

## 2020-07-31 NOTE — Progress Notes (Signed)
Sent in zofran to take to beach for as needed nausea

## 2020-08-21 ENCOUNTER — Other Ambulatory Visit: Payer: Self-pay | Admitting: Adult Health

## 2020-08-28 ENCOUNTER — Other Ambulatory Visit: Payer: Self-pay

## 2020-08-28 ENCOUNTER — Ambulatory Visit (INDEPENDENT_AMBULATORY_CARE_PROVIDER_SITE_OTHER): Payer: 59

## 2020-08-28 VITALS — Temp 96.8°F

## 2020-08-28 DIAGNOSIS — Z23 Encounter for immunization: Secondary | ICD-10-CM

## 2020-10-15 ENCOUNTER — Encounter: Payer: 59 | Admitting: Adult Health

## 2020-10-16 ENCOUNTER — Other Ambulatory Visit: Payer: Self-pay | Admitting: Internal Medicine

## 2020-10-31 NOTE — Progress Notes (Signed)
Complete Physical  Assessment and Plan:  Diagnoses and all orders for this visit:  Encounter for general adult medical examination with abnormal findings Continue annual mammogram via breast center Shingrix 1 year after shingles  Migraine without status migrainosus, not intractable, unspecified migraine type Well managed by current regimen; topamax daily as prophylaxis with injectable triptan - reports 3-4 annually.  Ongoing issues with sinus type headaches managed by daily allergy medication, Flonase.   Gastroesophageal reflux disease, esophagitis presence not specified Well managed with avoidance of triggers Discussed diet, avoiding triggers and other lifestyle changes  Irritable bowel syndrome, with diarrhea Patient reports well managed by avoidance of triggers (fastfoods, etc) Uses metamucil daily which has reportedly improved symptoms; discussed imodium as needed  Mixed hyperlipidemia At goal with lifestyle and omega 3 supplementation Continue low cholesterol diet and exercise.  -     Lipid panel -     TSH  Overweight (BMI 25.0-29.9) Long discussion about weight loss, diet, and exercise Recommended diet heavy in fruits and veggies and low in animal meats, cheeses, and dairy products, appropriate calorie intake Discussed appropriate weight for height  Follow up at next visit  Screening for cardiovascular condition/Hx of abnormal EKG History of abnormal EKG; stable Has seen cardiology with normal ECHO 2012 Monitor  -     EKG 12-Lead  History of colon polyps 5 year follow up per Dr. Rhea Belton, due 12/2020, referral placed to schedule  Screening for diabetes mellitus -     Hemoglobin A1c  Screening for hematuria or proteinuria -     Urinalysis, Complete (81001)  Vitamin D deficiency -     VITAMIN D 25 Hydroxy (Vit-D Deficiency, Fractures)  Medication management -     CBC with Differential/Platelet -     CMPGFR  Bilateral knee pain Ortho [facility] is  following; syncvasc injection minimally beneficial meloxicam PRN, steroid tapers helpful when needed Likely will need surgery; trying to postpone   Osteopenia Long history of osteopenia - have been tracking routinely q2-5 years by DEXA - last early 2019 Recommended weight bearing exercises, continue vitamin D/Calcium supplementation  Depressed Mild, follows covid 19 case Discussed therapy/CBT Stress management techniques discussed, increase water, good sleep hygiene discussed, increase exercise, and increase veggies.   Orders Placed This Encounter  Procedures  . CBC with Differential/Platelet  . COMPLETE METABOLIC PANEL WITH GFR  . Magnesium  . Lipid panel  . TSH  . Hemoglobin A1c  . VITAMIN D 25 Hydroxy (Vit-D Deficiency, Fractures)  . Urinalysis, Routine w reflex microscopic  . SARS-CoV-2 Antibody(IgG)Spike,Semi-Quantitative  . Ambulatory referral to Gastroenterology  . EKG 12-Lead     Discussed med's effects and SE's. Screening labs and tests as requested with regular follow-up as recommended. Over 40 minutes of exam, counseling, chart review, and complex, high level critical decision making was performed this visit.   Future Appointments  Date Time Provider Department Center  11/10/2020 10:00 AM Judd Gaudier, NP [facility] None  11/04/2021  3:30 PM Judd Gaudier, NP [facility] None     HPI  54 y.o. female  presents for a complete physical and follow up for has Hyperlipidemia; Migraine headache; GERD; IBS; Abnormal EKG; Overweight (BMI 25.0-29.9); Osteopenia; Personal history of colonic polyps; History of basal cell cancer; and Osteoarthritis of both knees on their problem list.   She is married, 2 kids, 4 grand kids. Fresh back from [employer]. She is phlebotomist at our office.   Having some depressed mood and poor motivation after she had covid 19  1 year ago; taking more time away, beach vacations, spending time with grandkids helps, doesn't want med. Discussed  counseling.  She reports ongoing regular headaches - she describes most are "sinus headaches" - improved by daily allergy medication, regular use of flonase. She has declined ENT evaluation. She reports 3-4 migraines a year for which she is prescribed topamax 50 mg as prophylaxis and injectable triptan as abortive; she reports these medications have been very effective for her.   She has bilateral knee/foot pain, sees now following with [facility], mod to severe R knee tricompartmental arthritis, had synvasc injection 12/2019 but reports minimal improvement. Taking meloxicam PRN, steroid spurts very helpful if needed.   BMI is Body mass index is 27.1 kg/m., she has been working on diet, exercise limited by ortho conditions. Does walk at the beach. She reports water intake - 90+ fluid ounces. Sleeps 7 hours nightly. Did wegovy with benefit but stopped after constipation on 1.7 mg dose, did ok with lower doses and benefit, but wants to hold off for now.  Wt Readings from Last 3 Encounters:  11/03/20 153 lb (69.4 kg)  11/01/19 166 lb 6.4 oz (75.5 kg)  10/13/18 164 lb 3.2 oz (74.5 kg)   She had abnormal EKG, saw Dr. Percival Spanish in 2012 with unremarkable ECHO  Today their BP is BP: 122/78 She does not workout. She denies chest pain, shortness of breath, dizziness.   She is not on cholesterol medication and denies myalgias. Her cholesterol is not at goal. The cholesterol last visit was:   Lab Results  Component Value Date   CHOL 211 (H) 11/01/2019   HDL 52 11/01/2019   LDLCALC 134 (H) 11/01/2019   TRIG 133 11/01/2019   CHOLHDL 4.1 11/01/2019    Last A1C in the office was:  Lab Results  Component Value Date   HGBA1C 5.5 11/01/2019   Last GFR: Lab Results  Component Value Date   GFRNONAA 102 11/01/2019   Patient is on Vitamin D supplement and at goal, 5000 IU:    Lab Results  Component Value Date   VD25OH 110 (H) 11/01/2019        Current Medications:  Current Outpatient  Medications on File Prior to Visit  Medication Sig Dispense Refill  . Ascorbic Acid (VITAMIN C PO) Take 1,000 mg by mouth daily.     . Calcium Carb-Cholecalciferol (CALCIUM + D3) 600-200 MG-UNIT TABS Take 1,200 Units by mouth. Takes 600mg /72mcg BID    . Cholecalciferol (VITAMIN D3) 2000 units TABS Take 5,000 Units by mouth.    Marland Kitchen MAGNESIUM PO Take 250 mg by mouth daily.     . meloxicam (MOBIC) 15 MG tablet Take 1 tablet (15 mg total) by mouth daily. (Patient taking differently: Take 15 mg by mouth as needed.) 90 tablet 0  . ondansetron (ZOFRAN) 4 MG tablet Take 1 tablet (4 mg total) by mouth daily as needed for nausea or vomiting (Not sedating). 30 tablet 1  . predniSONE (DELTASONE) 20 MG tablet TAKE AS DIRECTED FOR SEVERE HEADACHE 30 tablet 1  . psyllium (METAMUCIL) 58.6 % powder Take 1 packet by mouth daily.    Marland Kitchen topiramate (TOPAMAX) 50 MG tablet Take 1 tablet 2 x /day at Suppertime & Bedtime for Dieting & Weight Loss 180 tablet 3  . ZEMBRACE SYMTOUCH 3 MG/0.5ML SOAJ Inject 1 pen into the skin as needed (Migraine). 3 pen 3  . zinc gluconate 50 MG tablet Take 50 mg by mouth daily.    . fish oil-omega-3  fatty acids 1000 MG capsule Take 1 g by mouth daily.  (Patient not taking: Reported on 11/03/2020)     No current facility-administered medications on file prior to visit.   Allergies:  Allergies  Allergen Reactions  . Oxycodone Other (See Comments)    Hallucinations  . Codeine    Medical History:  She has Hyperlipidemia; Migraine headache; GERD; IBS; Abnormal EKG; Overweight (BMI 25.0-29.9); Osteopenia; Personal history of colonic polyps; History of basal cell cancer; and Osteoarthritis of both knees on their problem list. Health Maintenance:   Immunization History  Administered Date(s) Administered  . Influenza Inj Mdck Quad With Preservative 10/13/2017, 08/28/2018, 08/21/2019, 08/28/2020  . Influenza Split 08/27/2014   Tetanus: 2014 Flu vaccine: 08/2020 Shingrix: recommend  06/2021 Covid 19: declines   Pap: s/p hysterectomy MGM: 05/2020 DEXA: 2019 - T -1.6 -has been stable since dx, defer for now  ECHO: 2012 -no significant abnormalities, EF 55-60%  Colonoscopy: 01/30/2016 due q5 colon polyp - Dr. Hilarie Fredrickson EGD: n/a  Last Dental Exam: Dr. Joya Martyr, last 2020, goes q71m Last Eye Exam: 2020, wears glasses  Last Derm: Dr. 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 Family History:  Herfamily history includes Arthritis in her father; Breast cancer in her maternal aunt and maternal aunt; Cancer in her father; Colitis in her brother; Diabetes in her father and mother; Heart attack in her maternal grandmother; Heart disease in her paternal grandfather; Heart disease (age of onset: 31) in her father; Hypertension in her mother; Kidney disease in her father; Migraines in her paternal grandmother; Miscarriages / [place] in her mother; Other in her paternal grandmother; Pancreatic cancer in her paternal grandfather; Stroke in her father. Social History:  She reports that she quit smoking about 14 years ago. Her smoking use included cigarettes. She has a 10.00 pack-year smoking history. She has never used smokeless tobacco. She reports current alcohol use. She reports that she does not use drugs.   Review of Systems: Review of Systems  Constitutional: Negative for malaise/fatigue and weight loss.  HENT: Negative for hearing loss and tinnitus.   Eyes: Negative for blurred vision and double  vision.  Respiratory: Negative for cough, shortness of breath and wheezing.   Cardiovascular: Negative for chest pain, palpitations, orthopnea, claudication and leg swelling.  Gastrointestinal: Positive for diarrhea (Intermittent, associated with trigger foods). Negative for abdominal pain, blood in stool, constipation, heartburn, melena, nausea and vomiting.  Genitourinary: Negative.   Musculoskeletal: Negative for joint pain and myalgias.  Skin: Negative for rash.  Neurological: Positive for headaches (Frequent sinus headaches; rare migraines). Negative for dizziness, tingling, sensory change and weakness.  Endo/Heme/Allergies: Positive for environmental allergies. Negative for polydipsia.  Psychiatric/Behavioral: Negative.  Negative for depression and substance abuse. The patient is not nervous/anxious and does not have insomnia.   All other systems reviewed and are negative.   Physical Exam: Estimated body mass index is 27.1 kg/m as calculated from the following:   Height as of this encounter: 5\' 3"  (1.6 m).   Weight as of this encounter: 153 lb (69.4 kg). BP 122/78   Pulse 73  Temp (!) 97.5 F (36.4 C)   Ht 5\' 3"  (1.6 m)   Wt 153 lb (69.4 kg)   SpO2 97%   BMI 27.10 kg/m  General Appearance: Well nourished, in no apparent distress.  Eyes: PERRLA, EOMs, conjunctiva no swelling or erythema Sinuses: No Frontal/maxillary tenderness  ENT/Mouth: Ext aud canals clear, normal light reflex with TMs without erythema, bulging. Good dentition. No erythema, swelling, or exudate on post pharynx. Tonsils not swollen or erythematous. Hearing normal.  Neck: Supple, thyroid normal. No bruits  Respiratory: Respiratory effort normal, BS equal bilaterally without rales, rhonchi, wheezing or stridor.  Cardio: RRR without murmurs, rubs or gallops. Brisk peripheral pulses without edema.  Chest: symmetric, with normal excursions and percussion.  Breasts: Declines today Abdomen: Soft, nontender, no  guarding, rebound, hernias, masses, or organomegaly.  Lymphatics: Non tender without lymphadenopathy.  Genitourinary: Defered; no concerns; s/p total hysterectomy Musculoskeletal: Full ROM all peripheral extremities,5/5 strength, and normal gait.  Skin: Warm, dry without rashes, lesions, ecchymosis. Neuro: Cranial nerves intact, reflexes equal bilaterally. Normal muscle tone, no cerebellar symptoms. Sensation intact.  Psych: Awake and oriented X 3, normal affect, Insight and Judgment appropriate.   EKG: Stable from previous - no ST changes   Izora Ribas 4:35 PM [facility]

## 2020-11-03 ENCOUNTER — Encounter: Payer: 59 | Admitting: Adult Health Nurse Practitioner

## 2020-11-03 ENCOUNTER — Other Ambulatory Visit: Payer: Self-pay

## 2020-11-03 ENCOUNTER — Encounter: Payer: Self-pay | Admitting: Adult Health

## 2020-11-03 ENCOUNTER — Ambulatory Visit (INDEPENDENT_AMBULATORY_CARE_PROVIDER_SITE_OTHER): Payer: 59 | Admitting: Adult Health

## 2020-11-03 VITALS — BP 122/78 | HR 73 | Temp 97.5°F | Ht 63.0 in | Wt 153.0 lb

## 2020-11-03 DIAGNOSIS — Z0000 Encounter for general adult medical examination without abnormal findings: Secondary | ICD-10-CM

## 2020-11-03 DIAGNOSIS — G43909 Migraine, unspecified, not intractable, without status migrainosus: Secondary | ICD-10-CM

## 2020-11-03 DIAGNOSIS — Z136 Encounter for screening for cardiovascular disorders: Secondary | ICD-10-CM

## 2020-11-03 DIAGNOSIS — Z131 Encounter for screening for diabetes mellitus: Secondary | ICD-10-CM

## 2020-11-03 DIAGNOSIS — E782 Mixed hyperlipidemia: Secondary | ICD-10-CM

## 2020-11-03 DIAGNOSIS — Z1329 Encounter for screening for other suspected endocrine disorder: Secondary | ICD-10-CM

## 2020-11-03 DIAGNOSIS — K219 Gastro-esophageal reflux disease without esophagitis: Secondary | ICD-10-CM

## 2020-11-03 DIAGNOSIS — Z86010 Personal history of colonic polyps: Secondary | ICD-10-CM

## 2020-11-03 DIAGNOSIS — M8588 Other specified disorders of bone density and structure, other site: Secondary | ICD-10-CM

## 2020-11-03 DIAGNOSIS — E559 Vitamin D deficiency, unspecified: Secondary | ICD-10-CM

## 2020-11-03 DIAGNOSIS — M170 Bilateral primary osteoarthritis of knee: Secondary | ICD-10-CM

## 2020-11-03 DIAGNOSIS — Z20822 Contact with and (suspected) exposure to covid-19: Secondary | ICD-10-CM

## 2020-11-03 DIAGNOSIS — E663 Overweight: Secondary | ICD-10-CM

## 2020-11-03 DIAGNOSIS — Z1389 Encounter for screening for other disorder: Secondary | ICD-10-CM

## 2020-11-03 DIAGNOSIS — R030 Elevated blood-pressure reading, without diagnosis of hypertension: Secondary | ICD-10-CM

## 2020-11-03 DIAGNOSIS — Z85828 Personal history of other malignant neoplasm of skin: Secondary | ICD-10-CM

## 2020-11-03 DIAGNOSIS — K580 Irritable bowel syndrome with diarrhea: Secondary | ICD-10-CM

## 2020-11-03 NOTE — Patient Instructions (Addendum)
Kelly Mclaughlin , Thank you for taking time to come for your Annual Wellness Visit. I appreciate your ongoing commitment to your health goals. Please review the following plan we discussed and let me know if I can assist you in the future.   These are the goals we discussed: Goals    . LDL CALC < 100    . Weight (lb) < 145 lb (65.8 kg)       This is a list of the screening recommended for you and due dates:  Health Maintenance  Topic Date Due  .  Hepatitis C: One time screening is recommended by Center for Disease Control  (CDC) for  adults born from [birth year] through 1965.   Never done  . COVID-19 Vaccine (1) 11/19/2020*  . Colon Cancer Screening  01/29/2021  . Mammogram  05/30/2022  . Tetanus Vaccine  04/12/2023  . Flu Shot  Completed  . HIV Screening  Discontinued  *Topic was postponed. The date shown is not the original due date.    Counseling services  I suggest calling your insurance and finding out who is in your network and THEN calling those people or looking them up on google.   I'm a big fan of Cognitive Behavioral Therapy, look this up on You tube or check with the therapist you see if they are certified.  This form of therapy helps to teach you skills to better handle with current situation that are causing anxiety or depression.   There are some great apps too Check out GTHX, give thanks app.  Meditations apps are great like headspace.      High-Fiber Diet Fiber, also called dietary fiber, is a type of carbohydrate that is found in fruits, vegetables, whole grains, and beans. A high-fiber diet can have many health benefits. Your health care provider may recommend a high-fiber diet to help:  Prevent constipation. Fiber can make your bowel movements more regular.  Lower your cholesterol.  Relieve the following conditions: ? Swelling of veins in the anus (hemorrhoids). ? Swelling and irritation (inflammation) of specific areas of the digestive tract (uncomplicated  diverticulosis). ? A problem of the large intestine (colon) that sometimes causes pain and diarrhea (irritable bowel syndrome, IBS).  Prevent overeating as part of a weight-loss plan.  Prevent heart disease, type 2 diabetes, and certain cancers. What is my plan? The recommended daily fiber intake in grams (g) includes:  38 g for men age 61 or younger.  30 g for men over age 85.  25 g for women age 60 or younger.  21 g for women over age 5. You can get the recommended daily intake of dietary fiber by:  Eating a variety of fruits, vegetables, grains, and beans.  Taking a fiber supplement, if it is not possible to get enough fiber through your diet. What do I need to know about a high-fiber diet?  It is better to get fiber through food sources rather than from fiber supplements. There is not a lot of research about how effective supplements are.  Always check the fiber content on the nutrition facts label of any prepackaged food. Look for foods that contain 5 g of fiber or more per serving.  Talk with a diet and nutrition specialist (dietitian) if you have questions about specific foods that are recommended or not recommended for your medical condition, especially if those foods are not listed below.  Gradually increase how much fiber you consume. If you increase your intake of dietary  fiber too quickly, you may have bloating, cramping, or gas.  Drink plenty of water. Water helps you to digest fiber. What are tips for following this plan?  Eat a wide variety of high-fiber foods.  Make sure that half of the grains that you eat each day are whole grains.  Eat breads and cereals that are made with whole-grain flour instead of refined flour or white flour.  Eat brown rice, bulgur wheat, or millet instead of white rice.  Start the day with a breakfast that is high in fiber, such as a cereal that contains 5 g of fiber or more per serving.  Use beans in place of meat in soups,  salads, and pasta dishes.  Eat high-fiber snacks, such as berries, raw vegetables, nuts, and popcorn.  Choose whole fruits and vegetables instead of processed forms like juice or sauce. What foods can I eat?  Fruits Berries. Pears. Apples. Oranges. Avocado. Prunes and raisins. Dried figs. Vegetables Sweet potatoes. Spinach. Kale. Artichokes. Cabbage. Broccoli. Cauliflower. Green peas. Carrots. Squash. Grains Whole-grain breads. Multigrain cereal. Oats and oatmeal. Brown rice. Barley. Bulgur wheat. [place]. Quinoa. Bran muffins. Popcorn. Rye wafer crackers. Meats and other proteins Navy, kidney, and pinto beans. Soybeans. Split peas. Lentils. Nuts and seeds. Dairy Fiber-fortified yogurt. Beverages Fiber-fortified soy milk. Fiber-fortified orange juice. Other foods Fiber bars. The items listed above may not be a complete list of recommended foods and beverages. Contact a dietitian for more options. What foods are not recommended? Fruits Fruit juice. Cooked, strained fruit. Vegetables Fried potatoes. Canned vegetables. Well-cooked vegetables. Grains White bread. Pasta made with refined flour. White rice. Meats and other proteins Fatty cuts of meat. Fried chicken or fried fish. Dairy Milk. Yogurt. Cream cheese. Sour cream. Fats and oils Butters. Beverages Soft drinks. Other foods Cakes and pastries. The items listed above may not be a complete list of foods and beverages to avoid. Contact a dietitian for more information. Summary  Fiber is a type of carbohydrate. It is found in fruits, vegetables, whole grains, and beans.  There are many health benefits of eating a high-fiber diet, such as preventing constipation, lowering blood cholesterol, helping with weight loss, and reducing your risk of heart disease, diabetes, and certain cancers.  Gradually increase your intake of fiber. Increasing too fast can result in cramping, bloating, and gas. Drink plenty of water while you  increase your fiber.  The best sources of fiber include whole fruits and vegetables, whole grains, nuts, seeds, and beans. This information is not intended to replace advice given to you by your health care provider. Make sure you discuss any questions you have with your health care provider. Document Revised: 08/22/2017 Document Reviewed: 08/22/2017 Elsevier Patient Education  [place].      Cognitive Behavioral Therapy Cognitive behavioral therapy (CBT) is a short-term, goal-oriented type of talk therapy. CBT can help you:  Identify patterns of thinking, feeling, and behaving that are causing you problems.  Decide how you want to think, feel, and respond to life events.  Set goals to change the beliefs and thoughts that cause you to act in ways that are not helpful for you.  Follow up on the changes that you make. What are the different types of CBT? The different types of CBT include:  Dialectical behavioral therapy (DBT). This approach is often used in group therapy, and it aids a person in managing behavior by focusing on: ? Things that cause problems to start (triggers). ? Methods of self-calming. ?  Re-evaluating thinking processes.  Mindfulness-based cognitive therapy. This approach involves focusing your attention, meditating, and developing awareness of the present moment (mindfulness).  Rational emotive behavior therapy. This approach uses rational thought to reframe your thinking so it is less judgmental. Your therapist may directly challenge your thought processes.  Stress inoculation training. This approach involves planning ahead for stressful situations by practicing new thoughts and behaviors. This planning can help you avoid going back to old actions.  Acceptance and commitment therapy (ACT). This approach focuses on accepting yourself as you are and practicing mindfulness. It helps you understand what you would like to change and how you can set goals in  that direction. What conditions is CBT used to treat? CBT may help to treat:  Mental health conditions, including: ? Depression. ? Anxiety. ? Bipolar disorder. ? Eating disorders. ? Post-traumatic stress disorder (PTSD). ? Obsessive-compulsive disorder (OCD).  Insomnia and other sleep disorders.  Pain.  Stress.  Coping with loss or grief.  Coping with a difficult medical diagnosis or illness.  Relationship problems.  Emotional distress or shock (trauma). How can CBT help me? CBT may:  Give you a chance to share your thoughts, feelings, problems, and fears in a safe space.  Help you focus on specific problems.  Give you homework that helps you put theory into practice. Homework may include keeping a journal or doing thinking exercises.  Help you become aware of your patterns of thinking, feeling, and behaving, and how those three patterns affect each other.  Change your thoughts so that you can change your behaviors.  Help you chose how you want to view the world.  Teach you planned coping skills and offer better ways to deal with stress and difficult situations. To make the most of CBT, make sure you:  Find a licensed therapist whom you trust.  Take an active part in your therapy and do the homework that you are given.  Are honest about your problems.  Avoid skipping your therapy sessions. Summary  Cognitive behavioral therapy (CBT) is a short-term, goal-oriented type of talk therapy.  CBT can help you become aware of your patterns and the relationships among your thoughts, feelings, and behavior.  CBT may help mental health conditions and other problems. This information is not intended to replace advice given to you by your health care provider. Make sure you discuss any questions you have with your health care provider. Document Revised: 07/11/2019 Document Reviewed: 03/01/2017 Elsevier Patient Education  [place].

## 2020-11-04 LAB — CBC WITH DIFFERENTIAL/PLATELET
Absolute Monocytes: 594 cells/uL (ref 200–950)
Basophils Absolute: 45 cells/uL (ref 0–200)
Basophils Relative: 0.4 %
Eosinophils Absolute: 157 cells/uL (ref 15–500)
Eosinophils Relative: 1.4 %
HCT: 39.4 % (ref 35.0–45.0)
Hemoglobin: 13.5 g/dL (ref 11.7–15.5)
Lymphs Abs: 3203 cells/uL (ref 850–3900)
MCH: 31.4 pg (ref 27.0–33.0)
MCHC: 34.3 g/dL (ref 32.0–36.0)
MCV: 91.6 fL (ref 80.0–100.0)
MPV: 9.7 fL (ref 7.5–12.5)
Monocytes Relative: 5.3 %
Neutro Abs: 7202 cells/uL (ref 1500–7800)
Neutrophils Relative %: 64.3 %
Platelets: 281 10*3/uL (ref 140–400)
RBC: 4.3 10*6/uL (ref 3.80–5.10)
RDW: 12.8 % (ref 11.0–15.0)
Total Lymphocyte: 28.6 %
WBC: 11.2 10*3/uL — ABNORMAL HIGH (ref 3.8–10.8)

## 2020-11-04 LAB — URINALYSIS, ROUTINE W REFLEX MICROSCOPIC
Bilirubin Urine: NEGATIVE
Glucose, UA: NEGATIVE
Hgb urine dipstick: NEGATIVE
Ketones, ur: NEGATIVE
Leukocytes,Ua: NEGATIVE
Nitrite: NEGATIVE
Protein, ur: NEGATIVE
Specific Gravity, Urine: 1.008 (ref 1.001–1.03)
pH: 5.5 (ref 5.0–8.0)

## 2020-11-04 LAB — COMPLETE METABOLIC PANEL WITH GFR
AG Ratio: 2.5 (calc) (ref 1.0–2.5)
ALT: 30 U/L — ABNORMAL HIGH (ref 6–29)
AST: 16 U/L (ref 10–35)
Albumin: 4.5 g/dL (ref 3.6–5.1)
Alkaline phosphatase (APISO): 65 U/L (ref 37–153)
BUN: 15 mg/dL (ref 7–25)
CO2: 25 mmol/L (ref 20–32)
Calcium: 9.3 mg/dL (ref 8.6–10.4)
Chloride: 108 mmol/L (ref 98–110)
Creat: 0.95 mg/dL (ref 0.50–1.05)
GFR, Est African American: 79 mL/min/{1.73_m2} (ref >=60)
GFR, Est Non African American: 68 mL/min/{1.73_m2} (ref >=60)
Globulin: 1.8 g/dL (calc) — ABNORMAL LOW (ref 1.9–3.7)
Glucose, Bld: 84 mg/dL (ref 65–99)
Potassium: 4.1 mmol/L (ref 3.5–5.3)
Sodium: 143 mmol/L (ref 135–146)
Total Bilirubin: 0.4 mg/dL (ref 0.2–1.2)
Total Protein: 6.3 g/dL (ref 6.1–8.1)

## 2020-11-04 LAB — LIPID PANEL
Cholesterol: 211 mg/dL — ABNORMAL HIGH (ref <200)
HDL: 63 mg/dL (ref >=50)
LDL Cholesterol (Calc): 119 mg/dL (calc) — ABNORMAL HIGH
Non-HDL Cholesterol (Calc): 148 mg/dL (calc) — ABNORMAL HIGH (ref <130)
Total CHOL/HDL Ratio: 3.3 (calc) (ref <5.0)
Triglycerides: 172 mg/dL — ABNORMAL HIGH (ref <150)

## 2020-11-04 LAB — HEMOGLOBIN A1C
Hgb A1c MFr Bld: 5.3 % of total Hgb (ref <5.7)
Mean Plasma Glucose: 105 mg/dL
eAG (mmol/L): 5.8 mmol/L

## 2020-11-04 LAB — TSH: TSH: 1.09 mIU/L

## 2020-11-04 LAB — MAGNESIUM: Magnesium: 2.2 mg/dL (ref 1.5–2.5)

## 2020-11-04 LAB — VITAMIN D 25 HYDROXY (VIT D DEFICIENCY, FRACTURES): Vit D, 25-Hydroxy: 90 ng/mL (ref 30–100)

## 2020-11-04 LAB — SARS-COV-2 ANTIBODY(IGG)SPIKE,SEMI-QUANTITATIVE: SARS COV1 AB(IGG)SPIKE,SEMI QN: 1.08 index — ABNORMAL HIGH (ref <1.00)

## 2020-11-10 ENCOUNTER — Encounter: Payer: Self-pay | Admitting: Adult Health

## 2020-11-21 ENCOUNTER — Other Ambulatory Visit: Payer: Self-pay | Admitting: Internal Medicine

## 2020-11-24 ENCOUNTER — Encounter: Payer: Self-pay | Admitting: Internal Medicine

## 2021-01-09 ENCOUNTER — Other Ambulatory Visit: Payer: Self-pay

## 2021-01-09 ENCOUNTER — Ambulatory Visit (AMBULATORY_SURGERY_CENTER): Payer: 59

## 2021-01-09 VITALS — Ht 63.0 in | Wt 155.0 lb

## 2021-01-09 DIAGNOSIS — Z86010 Personal history of colonic polyps: Secondary | ICD-10-CM

## 2021-01-09 MED ORDER — NA SULFATE-K SULFATE-MG SULF 17.5-3.13-1.6 GM/177ML PO SOLN
1.0000 | Freq: Once | ORAL | 0 refills | Status: AC
Start: 2021-01-09 — End: 2021-01-09

## 2021-01-09 NOTE — Progress Notes (Signed)
Pre visit completed via phone call; Patient verified name, DOB, and address; No egg or soy allergy known to patient  No issues with past sedation with any surgeries or procedures No intubation problems in the past  No FH of Malignant Hyperthermia No diet pills per patient No home 02 use per patient  No blood thinners per patient  Pt denies issues with constipation  No A fib or A flutter  EMMI video via [place] 19 guidelines implemented in PV today with Pt and RN  Coupon given to pt in PV today, Code to Pharmacy and  NO PA's for preps discussed with pt In PV today  Discussed with pt there will be an out-of-pocket cost for prep and that varies from $0 to 70 dollars;  Due to the COVID-19 pandemic we are asking patients to follow certain guidelines. Pt aware of COVID protocols and [facility] guidelines

## 2021-01-12 ENCOUNTER — Encounter: Payer: Self-pay | Admitting: Internal Medicine

## 2021-01-22 ENCOUNTER — Encounter: Payer: 59 | Admitting: Internal Medicine

## 2021-01-23 ENCOUNTER — Other Ambulatory Visit: Payer: 59

## 2021-01-23 ENCOUNTER — Encounter: Payer: Self-pay | Admitting: Internal Medicine

## 2021-01-23 ENCOUNTER — Other Ambulatory Visit: Payer: Self-pay

## 2021-01-23 ENCOUNTER — Ambulatory Visit (AMBULATORY_SURGERY_CENTER): Payer: 59 | Admitting: Internal Medicine

## 2021-01-23 VITALS — BP 128/80 | HR 54 | Temp 97.8°F | Resp 11 | Ht 63.0 in | Wt 155.0 lb

## 2021-01-23 DIAGNOSIS — D123 Benign neoplasm of transverse colon: Secondary | ICD-10-CM | POA: Diagnosis present

## 2021-01-23 DIAGNOSIS — Z86010 Personal history of colonic polyps: Secondary | ICD-10-CM

## 2021-01-23 DIAGNOSIS — K529 Noninfective gastroenteritis and colitis, unspecified: Secondary | ICD-10-CM | POA: Diagnosis not present

## 2021-01-23 MED ORDER — SODIUM CHLORIDE 0.9 % IV SOLN: 500.0000 mL | Freq: Once | INTRAVENOUS | Status: DC

## 2021-01-23 NOTE — Patient Instructions (Signed)
Handout given:  Polyp and diverticulosis Resume previous diet  continue current medications  Await pathology results Celiac panel   YOU HAD AN ENDOSCOPIC PROCEDURE TODAY AT [place]:   Refer to the procedure report that was given to you for any specific questions about what was found during the examination.  If the procedure report does not answer your questions, please call your gastroenterologist to clarify.  If you requested that your care partner not be given the details of your procedure findings, then the procedure report has been included in a sealed envelope for you to review at your convenience later.  YOU SHOULD EXPECT: Some feelings of bloating in the abdomen. Passage of more gas than usual.  Walking can help get rid of the air that was put into your GI tract during the procedure and reduce the bloating. If you had a lower endoscopy (such as a colonoscopy or flexible sigmoidoscopy) you may notice spotting of blood in your stool or on the toilet paper. If you underwent a bowel prep for your procedure, you may not have a normal bowel movement for a few days.  Please Note:  You might notice some irritation and congestion in your nose or some drainage.  This is from the oxygen used during your procedure.  There is no need for concern and it should clear up in a day or so.  SYMPTOMS TO REPORT IMMEDIATELY:   Following lower endoscopy (colonoscopy or flexible sigmoidoscopy):  Excessive amounts of blood in the stool  Significant tenderness or worsening of abdominal pains  Swelling of the abdomen that is new, acute  Fever of 100F or higher  For urgent or emergent issues, a gastroenterologist can be reached at any hour by calling [phone]. Do not use MyChart messaging for urgent concerns.   DIET:  We do recommend a small meal at first, but then you may proceed to your regular diet.  Drink plenty of fluids but you should avoid alcoholic beverages for 24  hours.  ACTIVITY:  You should plan to take it easy for the rest of today and you should NOT DRIVE or use heavy machinery until tomorrow (because of the sedation medicines used during the test).    FOLLOW UP: Our staff will call the number listed on your records 48-72 hours following your procedure to check on you and address any questions or concerns that you may have regarding the information given to you following your procedure. If we do not reach you, we will leave a message.  We will attempt to reach you two times.  During this call, we will ask if you have developed any symptoms of COVID 19. If you develop any symptoms (ie: fever, flu-like symptoms, shortness of breath, cough etc.) before then, please call [phone].  If you test positive for Covid 19 in the 2 weeks post procedure, please call and report this information to [place].    If any biopsies were taken you will be contacted by phone or by letter within the next 1-3 weeks.  Please call us at [phone] if you have not heard about the biopsies in 3 weeks.   SIGNATURES/CONFIDENTIALITY: You and/or your care partner have signed paperwork which will be entered into your electronic medical record.  These signatures attest to the fact that that the information above on your After Visit Summary has been reviewed and is understood.  Full responsibility of the confidentiality of this discharge information lies with you and/or your care-partner.

## 2021-01-23 NOTE — Progress Notes (Signed)
VS-CW  Pt's states no medical or surgical changes since previsit or office visit.  

## 2021-01-23 NOTE — Op Note (Signed)
[place] Patient Name: Kelly Mclaughlin Procedure Date: 01/23/2021 7:34 AM MRN: [id number] Endoscopist: Jerene Bears , MD Age: 55 Referring MD:  Date of Birth: 10-Jul-1966 Gender: Female Account #: [id number] Procedure:                Colonoscopy Indications:              High risk colon cancer surveillance: Personal                            history of sessile serrated colon polyp (less than                            10 mm in size) with no dysplasia on initial                            colonoscopy, Last colonoscopy: March 2017 with                            distal hyperplastic polyp only; patient reports                            daily, chronic diarrhea Medicines:                Monitored Anesthesia Care Procedure:                Pre-Anesthesia Assessment:                           - Prior to the procedure, a History and Physical                            was performed, and patient medications and                            allergies were reviewed. The patient's tolerance of                            previous anesthesia was also reviewed. The risks                            and benefits of the procedure and the sedation                            options and risks were discussed with the patient.                            All questions were answered, and informed consent                            was obtained. Prior Anticoagulants: The patient has                            taken no previous anticoagulant or antiplatelet  agents. ASA Grade Assessment: II - A patient with                            mild systemic disease. After reviewing the risks                            and benefits, the patient was deemed in                            satisfactory condition to undergo the procedure.                           After obtaining informed consent, the colonoscope                            was passed under direct vision. Throughout the                             procedure, the patient's blood pressure, pulse, and                            oxygen saturations were monitored continuously. The                            Olympus PFC-[id number] (#[id number]) Colonoscope was                            introduced through the anus and advanced to the                            terminal ileum. The colonoscopy was performed                            without difficulty. The patient tolerated the                            procedure well. The quality of the bowel                            preparation was good. The terminal ileum, ileocecal                            valve, appendiceal orifice, and rectum were                            photographed. Scope In: 8:10:52 AM Scope Out: 8:27:42 AM Scope Withdrawal Time: 0 hours 14 minutes 3 seconds  Total Procedure Duration: 0 hours 16 minutes 50 seconds  Findings:                 The digital rectal exam was normal.                           A 5 mm polyp was found in the transverse colon. The  polyp was sessile. The polyp was removed with a                            cold snare. Resection and retrieval were complete.                           Multiple medium-mouthed diverticula were found in                            the sigmoid colon.                           The exam was otherwise without abnormality on                            direct and retroflexion views.                           Biopsies for histology were taken with a cold                            forceps from the right colon and left colon for                            evaluation of microscopic colitis. Complications:            No immediate complications. Estimated Blood Loss:     Estimated blood loss was minimal. Impression:               - One 5 mm polyp in the transverse colon, removed                            with a cold snare. Resected and retrieved.                           - Diverticulosis in  the sigmoid colon.                           - The examination was otherwise normal on direct                            and retroflexion views.                           - Biopsies were taken with a cold forceps from the                            right colon and left colon for evaluation of                            microscopic colitis. Recommendation:           - Patient has a contact number available for                            emergencies.  The signs and symptoms of potential                            delayed complications were discussed with the                            patient. Return to normal activities tomorrow.                            Written discharge instructions were provided to the                            patient.                           - Resume previous diet.                           - Continue present medications.                           - Await pathology results.                           - Celiac panel today.                           - Office follow-up next available to discuss                            chronic diarrhea.                           - Repeat colonoscopy is recommended for                            surveillance. The colonoscopy date will be                            determined after pathology results from today's                            exam become available for review. Jerene Bears, MD 01/23/2021 8:34:03 AM This report has been signed electronically.

## 2021-01-23 NOTE — Progress Notes (Signed)
PT taken to PACU. Monitors in place. VSS. Report given to RN. 

## 2021-01-23 NOTE — Progress Notes (Signed)
Called to room to assist during endoscopic procedure.  Patient ID and intended procedure confirmed with present staff. Received instructions for my participation in the procedure from the performing physician.  

## 2021-01-26 LAB — TISSUE TRANSGLUTAMINASE, IGA: (tTG) Ab, IgA: <1 U/mL

## 2021-01-26 LAB — IGA: Immunoglobulin A: 108 mg/dL (ref 47–310)

## 2021-01-27 ENCOUNTER — Telehealth: Payer: Self-pay | Admitting: *Deleted

## 2021-01-27 NOTE — Telephone Encounter (Signed)
  Follow up Call-  Call back number 01/23/2021  Post procedure Call Back phone  # [phone]  Permission to leave phone message Yes  Some recent data might be hidden     Patient questions:  Do you have a fever, pain , or abdominal swelling? No. Pain Score  0 *  Have you tolerated food without any problems? Yes.    Have you been able to return to your normal activities? Yes.    Do you have any questions about your discharge instructions: Diet   No. Medications  No. Follow up visit  No.  Do you have questions or concerns about your Care? No.  Actions: * If pain score is 4 or above: No action needed, pain <4.

## 2021-02-05 ENCOUNTER — Other Ambulatory Visit: Payer: Self-pay | Admitting: Internal Medicine

## 2021-02-05 ENCOUNTER — Telehealth: Payer: Self-pay | Admitting: *Deleted

## 2021-02-05 ENCOUNTER — Other Ambulatory Visit: Payer: Self-pay

## 2021-02-05 ENCOUNTER — Encounter: Payer: Self-pay | Admitting: Internal Medicine

## 2021-02-05 MED ORDER — RIFAXIMIN 550 MG PO TABS
550.0000 mg | ORAL_TABLET | Freq: Three times a day (TID) | ORAL | 0 refills | Status: DC
Start: 2021-02-05 — End: 2021-04-15

## 2021-02-05 NOTE — Telephone Encounter (Signed)
[facility] [employer] has approved patients' Xifaxan from 02/05/21-02/19/21  Kelly Mclaughlin (Key: [id number]) Xifaxan 550MG  tablets   Form [employer] Electronic PA Form (2017 NCPDP) Created 27 minutes ago Sent to Plan 20 minutes ago Plan Response 20 minutes ago Submit Clinical Questions 20 minutes ago Determination Favorable 20 minutes ago Message from [employer] Your PA request has been approved. Additional information will be provided in the approval communication. (Message 1145)

## 2021-02-09 ENCOUNTER — Other Ambulatory Visit: Payer: Self-pay | Admitting: Adult Health

## 2021-02-09 MED ORDER — DOXYCYCLINE HYCLATE 100 MG PO CAPS
ORAL_CAPSULE | ORAL | 0 refills | Status: DC
Start: 2021-02-09 — End: 2021-03-25

## 2021-03-25 ENCOUNTER — Other Ambulatory Visit: Payer: Self-pay | Admitting: Internal Medicine

## 2021-03-25 MED ORDER — TOPIRAMATE 100 MG PO TABS: ORAL_TABLET | ORAL | 1 refills | Status: DC

## 2021-03-25 MED ORDER — DEXAMETHASONE 4 MG PO TABS: ORAL_TABLET | ORAL | 1 refills | Status: DC

## 2021-03-25 MED ORDER — PHENTERMINE HCL 37.5 MG PO TABS: ORAL_TABLET | ORAL | 1 refills | Status: DC

## 2021-04-01 ENCOUNTER — Encounter: Payer: Self-pay | Admitting: *Deleted

## 2021-04-03 ENCOUNTER — Other Ambulatory Visit: Payer: Self-pay | Admitting: Internal Medicine

## 2021-04-03 DIAGNOSIS — Z1231 Encounter for screening mammogram for malignant neoplasm of breast: Secondary | ICD-10-CM

## 2021-04-15 ENCOUNTER — Ambulatory Visit (INDEPENDENT_AMBULATORY_CARE_PROVIDER_SITE_OTHER): Payer: 59 | Admitting: Internal Medicine

## 2021-04-15 ENCOUNTER — Encounter: Payer: Self-pay | Admitting: Internal Medicine

## 2021-04-15 VITALS — BP 126/68 | HR 78 | Ht 63.0 in | Wt 167.6 lb

## 2021-04-15 DIAGNOSIS — Z86010 Personal history of colonic polyps: Secondary | ICD-10-CM

## 2021-04-15 DIAGNOSIS — K580 Irritable bowel syndrome with diarrhea: Secondary | ICD-10-CM

## 2021-04-15 MED ORDER — VIBERZI 75 MG PO TABS: 1.0000 | ORAL_TABLET | Freq: Two times a day (BID) | ORAL | 0 refills | Status: DC

## 2021-04-15 NOTE — Patient Instructions (Addendum)
If you are age 55 or younger, your body mass index should be between 19-25. Your Body mass index is 29.69 kg/m. If this is out of the aformentioned range listed, please consider follow up with your Primary Care Provider.  __________________________________________________________  The [facility] providers would like to encourage you to use [facility] to communicate with providers for non-urgent requests or questions.  Due to long hold times on the telephone, sending your provider a message by [facility] may be a faster and more efficient way to get a response.  Please allow 48 business hours for a response.  Please remember that this is for non-urgent requests.  ___________________________________________________________  We have sent the following medications to your pharmacy for you to pick up at your convenience:  START: Viberzi 75mg  one tablet twice daily.  You will follow up with our office in 1 year (June 2023).  We will contact you to schedule the appointment.  Thank you for entrusting me with your care and choosing [facility].  Dr Hilarie Fredrickson

## 2021-04-15 NOTE — Progress Notes (Signed)
Subjective:    Patient ID: Kelly Mclaughlin, female    DOB: February 07, 1966, 55 y.o.   MRN: [id number]  HPI Kelly Mclaughlin is a 55 year old female with a history of colon polyps, IBS with diarrhea, diverticulosis, GERD who is here for follow-up.  She is here alone today.  She was last seen in the time of her colonoscopy on 01/23/2021.  This was to follow-up her history of sessile serrated polyps.  Colonoscopy on 01/23/2021 revealed a 5 mm adenoma removed from the transverse colon.  There was diverticulosis in the sigmoid.  Random biopsies from the right and left colon were negative for microscopic colitis.  After colonoscopy she was treated with rifaximin 550 mg 3 times daily x14 days.  She reports that this significantly improved her diarrhea and that her stools were less watery and runny and slightly less frequent.  She still has loose stools and is having a bowel movement at least daily.  There can still be some urgency and she reports that when she goes into a store "I still know where the bathroom is".  She is tried eating some gluten-free and feels that this may help.  She is not completely gluten-free.  She does not seem to have a problem with lactose-containing foods.  No abdominal pain or cramping.  No blood in stool or melena.  No upper GI complaint including nausea or vomiting.   Review of Systems As per HPI, otherwise negative  Current Medications, Allergies, Past Medical History, Past Surgical History, Family History and Social History were reviewed in [employer] record.    Objective:   Physical Exam Gen: awake, alert, NAD HEENT: anicteric Neuro: nonfocal  CBC    Component Value Date/Time   WBC 11.2 (H) 11/03/2020 1642   RBC 4.30 11/03/2020 1642   HGB 13.5 11/03/2020 1642   HCT 39.4 11/03/2020 1642   PLT 281 11/03/2020 1642   MCV 91.6 11/03/2020 1642   MCH 31.4 11/03/2020 1642   MCHC 34.3 11/03/2020 1642   RDW 12.8 11/03/2020 1642   LYMPHSABS 3,203  11/03/2020 1642   MONOABS 350 10/08/2016 0930   EOSABS 157 11/03/2020 1642   BASOSABS 45 11/03/2020 1642   CMP     Component Value Date/Time   NA 143 11/03/2020 1642   K 4.1 11/03/2020 1642   CL 108 11/03/2020 1642   CO2 25 11/03/2020 1642   GLUCOSE 84 11/03/2020 1642   BUN 15 11/03/2020 1642   CREATININE 0.95 11/03/2020 1642   CALCIUM 9.3 11/03/2020 1642   PROT 6.3 11/03/2020 1642   ALBUMIN 4.0 10/08/2016 0930   AST 16 11/03/2020 1642   ALT 30 (H) 11/03/2020 1642   ALKPHOS 71 10/08/2016 0930   BILITOT 0.4 11/03/2020 1642   GFRNONAA 68 11/03/2020 1642   GFRAA 79 11/03/2020 1642         Assessment & Plan:  55 year old female with a history of colon polyps, IBS with diarrhea, diverticulosis, GERD who is here for follow-up.   IBS - D --diagnosis consistent with irritable bowel syndrome with diarrhea predominance.  She did respond to rifaximin though still has some lingering symptoms with looser stools with urgency.  We discussed trying another medication which she is open to.  Her gallbladder is in place.  We will try Viberzi 75 mcg twice daily.  We discussed side effects including constipation, abdominal pain, bloating, nausea.  If any of these occur she is asked to let me know.  Samples provided for an  8-day course.  I asked that she send me a MyChart message to let me know if it is effective.  We have sent this prescription as well.  If ineffective consider cholestyramine/colestipol --Viberzi 75 mg twice daily --6 to 39-month follow-up  2.  History of sessile serrated polyp and tubular adenoma --7-year recall for colonoscopy which would be March 2029  30 minutes total spent today including patient facing time, coordination of care, reviewing medical history/procedures/pertinent radiology studies, and documentation of the encounter.

## 2021-04-17 ENCOUNTER — Other Ambulatory Visit: Payer: Self-pay

## 2021-04-17 MED ORDER — VIBERZI 75 MG PO TABS: 1.0000 | ORAL_TABLET | Freq: Two times a day (BID) | ORAL | 0 refills | Status: DC

## 2021-04-21 NOTE — Telephone Encounter (Signed)
[facility] has denied patient's Viberzi 75 mg, noting that she must have tried and failed "3 in a class with 3 or more formulary alternatives." Formulary alternatives include: alosetron, loperamide and Xifaxan. Per our records, patient has only tried and failed Xifaxan. Dr Hilarie Fredrickson, please advise.Marland KitchenMarland Kitchen

## 2021-04-28 NOTE — Telephone Encounter (Signed)
I have spoken to patient regarding taking lotronex as insurance recommends. She has made aware of the rare incidence of ischemic colitis with this medication and is unwilling to try the medication at this time, stating "if there is going to be a side effect, this girl is going to have it." She states that she does use imodium often which is helpful but not totally effective. Xifaxan has previously been tried and failed. Patient does tell me that she thinks the Viberzi samples were helping her.  Dr Hilarie Fredrickson- Do you have any additional suggestions for patient?

## 2021-04-29 NOTE — Telephone Encounter (Signed)
I would tell the insurance co the following: She has tried rifaximin and loperamide without adequate control of IBS-D symptoms She is not willing to accept (very reasonable in my opinion) the risks associated with alosetron thus making this not an option This should allow for approval of Viberzi I can try peer to peer if you run into further trouble JMP

## 2021-05-06 NOTE — Telephone Encounter (Signed)
We have received a denial of the appeal sent to [facility] for patient's Viberzi.   "[facility] has completed the review of your appeal for Viberzi. Based on a review of approved drug labeling and nationally accepted compendia and practice guidelines, a qualified health care professional has determined that your request is not medically necessary or is experimental or investigational. The reason for the denial of your appeal was:  Your appeal for Kelly Mclaughlin has been determined as not medically necessary. Per physician review, current plan approved criteria only allow coverage of Viberzi if the patient is unable to take the required number of formulary alternatives due to a trial and inadequate treatment response, intolerance, or contraindication (Requirement: you must have tried 3 alternatives it there are 3 or more covered alternatives); or if the patient has a clinical condition or needs a specific dosage form for which the formulary alternatives are not recommended based on published guidelines or clinical literature. Formulary alternatives are: alosetron; loperamide; Xifaxan 550 mg tablet. Your request was reviewed by an MD Board Certified in [place]." ___________________________________________________________  Since we still cannot get Viberzi, what are your thoughts?

## 2021-05-07 NOTE — Telephone Encounter (Signed)
Please let patient know that I wrote a letter on her behalf to appeal with the insurance decision regarding Viberzi They have again denied Viberzi without first having tried alosetron. She did not want to try alosetron due to possible risk of side effects, which I understand We could have her try Lomotil 1 tablet 3 times daily as needed for loose stools and diarrhea If she wishes to discuss more with me I am happy to see her in clinic Otherwise we can try again next year to get Viberzi approved if she changes insurance companies I am sorry her insurance company would not approve the medication that was helpful for her

## 2021-05-08 MED ORDER — DIPHENOXYLATE-ATROPINE 2.5-0.025 MG PO TABS: 1.0000 | ORAL_TABLET | Freq: Three times a day (TID) | ORAL | 1 refills | Status: AC | PRN

## 2021-05-08 NOTE — Telephone Encounter (Signed)
I have spoken to patient to advise of insurance decision to deny the appeal we sent regarding Viberzi. She is willing to try lomotil 1 tablet tid prn. She states that recently, her diarrhea has not been too bad; she has been able to take up to 6 imodium daily which seems to control it. Rx for lomotil has been sent to the pharmacy for her to try in place of imodium. Patient verbalizes understanding.

## 2021-05-08 NOTE — Addendum Note (Signed)
Addended by: Larina Bras on: 05/08/2021 12:22 PM   Modules accepted: Orders

## 2021-06-05 ENCOUNTER — Ambulatory Visit
Admission: RE | Admit: 2021-06-05 | Discharge: 2021-06-05 | Disposition: A | Discharge status: Home / Self Care | Payer: 59 | Source: Ambulatory Visit | Attending: Internal Medicine | Admitting: Internal Medicine

## 2021-06-05 ENCOUNTER — Other Ambulatory Visit: Payer: Self-pay

## 2021-06-05 DIAGNOSIS — Z1231 Encounter for screening mammogram for malignant neoplasm of breast: Secondary | ICD-10-CM

## 2021-07-16 ENCOUNTER — Other Ambulatory Visit: Payer: Self-pay | Admitting: Internal Medicine

## 2021-07-16 MED ORDER — NITROFURANTOIN MONOHYD MACRO 100 MG PO CAPS: ORAL_CAPSULE | ORAL | 0 refills | Status: DC

## 2021-07-16 MED ORDER — AZITHROMYCIN 250 MG PO TABS: ORAL_TABLET | ORAL | 1 refills | Status: DC

## 2021-07-16 MED ORDER — DEXAMETHASONE 4 MG PO TABS: ORAL_TABLET | ORAL | 1 refills | Status: DC

## 2021-07-22 ENCOUNTER — Other Ambulatory Visit: Payer: Self-pay | Admitting: Adult Health

## 2021-07-22 ENCOUNTER — Other Ambulatory Visit: Payer: 59

## 2021-07-22 ENCOUNTER — Other Ambulatory Visit: Payer: Self-pay

## 2021-07-22 DIAGNOSIS — R350 Frequency of micturition: Secondary | ICD-10-CM

## 2021-07-23 LAB — URINALYSIS, ROUTINE W REFLEX MICROSCOPIC
Bilirubin Urine: NEGATIVE
Glucose, UA: NEGATIVE
Hgb urine dipstick: NEGATIVE
Ketones, ur: NEGATIVE
Leukocytes,Ua: NEGATIVE
Nitrite: NEGATIVE
Protein, ur: NEGATIVE
Specific Gravity, Urine: 1.005 (ref 1.001–1.035)
pH: 7 (ref 5.0–8.0)

## 2021-07-23 LAB — URINE CULTURE
MICRO NUMBER:: 12404630
SPECIMEN QUALITY:: ADEQUATE

## 2021-08-24 ENCOUNTER — Other Ambulatory Visit: Payer: Self-pay | Admitting: Internal Medicine

## 2021-08-24 DIAGNOSIS — R7309 Other abnormal glucose: Secondary | ICD-10-CM

## 2021-08-24 DIAGNOSIS — R7301 Impaired fasting glucose: Secondary | ICD-10-CM

## 2021-08-24 MED ORDER — MOUNJARO 5 MG/0.5ML ~~LOC~~ SOAJ: 5.0000 mg | SUBCUTANEOUS | 1 refills | Status: DC

## 2021-08-24 NOTE — Telephone Encounter (Signed)
Prior Auth

## 2021-08-25 ENCOUNTER — Other Ambulatory Visit: Payer: Self-pay

## 2021-08-25 DIAGNOSIS — R7309 Other abnormal glucose: Secondary | ICD-10-CM

## 2021-08-25 DIAGNOSIS — R7301 Impaired fasting glucose: Secondary | ICD-10-CM

## 2021-08-25 MED ORDER — MOUNJARO 5 MG/0.5ML ~~LOC~~ SOAJ: 5.0000 mg | SUBCUTANEOUS | 1 refills | Status: DC

## 2021-09-22 ENCOUNTER — Other Ambulatory Visit: Payer: Self-pay | Admitting: Internal Medicine

## 2021-09-22 MED ORDER — ONDANSETRON HCL 8 MG PO TABS: ORAL_TABLET | ORAL | 1 refills | Status: DC

## 2021-10-06 ENCOUNTER — Other Ambulatory Visit: Payer: Self-pay | Admitting: Internal Medicine

## 2021-10-20 ENCOUNTER — Other Ambulatory Visit: Payer: Self-pay | Admitting: Internal Medicine

## 2021-10-20 DIAGNOSIS — R7309 Other abnormal glucose: Secondary | ICD-10-CM

## 2021-10-20 DIAGNOSIS — R7301 Impaired fasting glucose: Secondary | ICD-10-CM

## 2021-10-20 MED ORDER — MOUNJARO 5 MG/0.5ML ~~LOC~~ SOAJ: 5.0000 mg | SUBCUTANEOUS | 1 refills | Status: DC

## 2021-10-21 ENCOUNTER — Other Ambulatory Visit: Payer: Self-pay | Admitting: Internal Medicine

## 2021-10-21 ENCOUNTER — Other Ambulatory Visit: Payer: Self-pay | Admitting: Adult Health

## 2021-10-21 DIAGNOSIS — R7301 Impaired fasting glucose: Secondary | ICD-10-CM

## 2021-10-21 DIAGNOSIS — R7309 Other abnormal glucose: Secondary | ICD-10-CM

## 2021-10-21 MED ORDER — MOUNJARO 5 MG/0.5ML ~~LOC~~ SOAJ
5.0000 mg | SUBCUTANEOUS | 1 refills | Status: DC
Start: 2021-10-21 — End: 2021-12-14

## 2021-10-22 ENCOUNTER — Other Ambulatory Visit: Payer: Self-pay | Admitting: Internal Medicine

## 2021-11-02 ENCOUNTER — Other Ambulatory Visit: Payer: Self-pay | Admitting: Internal Medicine

## 2021-11-02 MED ORDER — DEXAMETHASONE 4 MG PO TABS: ORAL_TABLET | ORAL | 0 refills | Status: DC

## 2021-11-02 MED ORDER — PSEUDOEPHEDRINE HCL ER 120 MG PO TB12: ORAL_TABLET | ORAL | 3 refills | Status: DC

## 2021-11-03 NOTE — Progress Notes (Signed)
Complete Physical  Assessment and Plan:  Diagnoses and all orders for this visit:  Encounter for Annual Physical Exam with abnormal findings Due annually  Health Maintenance reviewed Healthy lifestyle reviewed and goals set Continue annual mammogram via breast center   Migraine without status migrainosus, not intractable, unspecified migraine type Fairly managed by current regimen; topamax daily as prophylaxis Insurance stopped covering injectible triptan, nurtec doesn't work quickly, failed imitrex, will try maxalt -  Ongoing issues with sinus type headaches - restarat daily allergy medication, Flonase.   Gastroesophageal reflux disease, esophagitis presence not specified Well managed with avoidance of triggers Discussed diet, avoiding triggers and other lifestyle changes  Irritable bowel syndrome, with diarrhea Patient reports well managed by avoidance of triggers (fastfoods, etc) And PRN lomotil   Mixed hyperlipidemia At goal with lifestyle and omega 3 supplementation Continue low cholesterol diet and exercise.  -     Lipid panel -     TSH  Overweight (BMI 25.0-29.9) Excellent progress with mounjaro  Long discussion about weight loss, diet, and exercise Recommended diet heavy in fruits and veggies and low in animal meats, cheeses, and dairy products, appropriate calorie intake Discussed appropriate weight for height  Follow up at next visit  Screening for cardiovascular condition/Hx of abnormal EKG History of abnormal EKG; stable Has seen cardiology with normal ECHO 2012 Monitor  -     EKG 12-Lead  History of colon polyps 7 year follow up per Dr. Hilarie Fredrickson, due 2029  Screening for hematuria or proteinuria -     Urinalysis, Complete (81001)  Vitamin D deficiency -     VITAMIN D 25 Hydroxy (Vit-D Deficiency, Fractures) - defer, requesting minimal labs, no recent dose changes  Medication management -     CBC with Differential/Platelet -     CMPGFR  Bilateral  knee pain Ortho [facility]/Wainer is following; syncvasc injection minimally beneficial meloxicam PRN, steroid tapers helpful when needed Likely will need surgery; trying to postpone   Osteopenia Long history of osteopenia - have been tracking routinely q2-5 years by DEXA - last early 2019, DEXA ordered to schedule with next mammogram Recommended weight bearing exercises, continue vitamin D/Calcium supplementation  Orders Placed This Encounter  Procedures   DG Bone Density   CBC with Differential/Platelet   COMPLETE METABOLIC PANEL WITH GFR   Lipid panel   Urinalysis, Routine w reflex microscopic   EKG 12-Lead   Discussed med's effects and SE's. Screening labs and tests as requested with regular follow-up as recommended. Over 40 minutes of exam, counseling, chart review, and complex, high level critical decision making was performed this visit.   Future Appointments  Date Time Provider [place]  11/04/2022  3:00 PM [name], NP [facility] None    HPI  56 y.o. female  presents for a complete physical and follow up for has Hyperlipidemia; Migraine headache; GERD; IBS; Abnormal EKG; Overweight (BMI 25.0-29.9); Osteopenia; Personal history of colonic polyps; History of basal cell cancer; and Osteoarthritis of both knees on their problem list.   She is married, 2 kids, 4 grand kids. Just back from [place] for New Years. She is phlebotomist at our office.   Mood is improved some, was really down after covid 19 infection, has improved, getting away more, seeing the grandkids.   She reports ongoing regular headaches - she describes most are "sinus headaches" - improved by daily allergy medication, regular use of flonase. She reports 3-4 migraines a year for which she is prescribed topamax 50 mg as  prophylaxis and nurtec as abortive; was doing better with injectable triptan but insurance stopped covering. Has failed imitrex in the past.  she reports these medications have been  very effective for her.   She has bilateral knee/foot pain, sees now following with Murphy/Wainer, mod to severe R knee tricompartmental arthritis, had synvasc injection 12/2019 but reports minimal improvement. Taking meloxicam PRN, steroid spurts are very helpful if needed.   GERD is rare, takes a tums if needed. IBS on lomotil doing fairly.   BMI is Body mass index is 25.51 kg/m., she has been working on diet, exercise limited by ortho conditions. Does walk at the beach. She reports water intake - 90+ fluid ounces. Sleeps 7 hours nightly. Did wegovy with benefit but stopped due to lack of insurance coverage, has been on mounjaro with free coupon.  Wt Readings from Last 3 Encounters:  11/04/21 144 lb (65.3 kg)  04/15/21 167 lb 9.6 oz (76 kg)  01/23/21 155 lb (70.3 kg)   She had abnormal EKG, saw Dr. Percival Spanish in 2012 with unremarkable ECHO.  Today their BP is BP: 120/86 She does not workout. She denies chest pain, shortness of breath, dizziness.   She is not on cholesterol medication and denies myalgias. Her cholesterol is not at goal. The cholesterol last visit was:   Lab Results  Component Value Date   CHOL 211 (H) 11/03/2020   HDL 63 11/03/2020   LDLCALC 119 (H) 11/03/2020   TRIG 172 (H) 11/03/2020   CHOLHDL 3.3 11/03/2020    Last A1C in the office was:  Lab Results  Component Value Date   HGBA1C 5.3 11/03/2020   Last GFR: Lab Results  Component Value Date   GFRNONAA 68 11/03/2020   Patient is on Vitamin D supplement and at goal, 5000 IU:    Lab Results  Component Value Date   VD25OH 90 11/03/2020        Current Medications:  Current Outpatient Medications on File Prior to Visit  Medication Sig Dispense Refill   Ascorbic Acid (VITAMIN C PO) Take 1,000 mg by mouth daily.      Calcium Carb-Cholecalciferol (CALCIUM + D3) 600-200 MG-UNIT TABS Take 1,200 Units by mouth daily.     Cholecalciferol (VITAMIN D3) 125 MCG (5000 UT) TABS Take 1 tablet by mouth daily.      dexamethasone (DECADRON) 4 MG tablet Take 1 tab 3 x /day for 2 days,      then 2 x /day for 2  Days,     then 1 tab daily 13 tablet 0   diphenoxylate-atropine (LOMOTIL) 2.5-0.025 MG tablet Take 1 tablet by mouth 3 (three) times daily as needed for diarrhea or loose stools. 90 tablet 1   MAGNESIUM PO Take 250 mg by mouth daily.      meloxicam (MOBIC) 15 MG tablet Take 1 tablet (15 mg total) by mouth daily. (Patient taking differently: Take 15 mg by mouth daily as needed.) 90 tablet 0   ondansetron (ZOFRAN) 8 MG tablet Take  1/2 to 1 tablet  every 6 to 8 hours as needed for Nausea / Vomiting 50 tablet 1   pseudoephedrine (SUDAFED) 120 MG 12 hr tablet Take  1 tablet  2 x /day (every 12 hours)  for Sinus & Chest Congestion 60 tablet 3   Rimegepant Sulfate (NURTEC) 75 MG TBDP Take 1 tablet by mouth as needed (for headaches).     tirzepatide [facility]) 5 MG/0.5ML Pen Inject 5 mg into the skin once a week.  6 mL 1   topiramate (TOPAMAX) 100 MG tablet TAKE 2 TABLETS AT BEDTIME FOR MIGRAINE PREVENTION & WEIGHT LOSS 180 tablet 1   zinc gluconate 50 MG tablet Take 50 mg by mouth daily.     Cetirizine HCl (ZYRTEC ALLERGY PO) Take 1 tablet by mouth daily. (Patient not taking: Reported on 11/04/2021)     No current facility-administered medications on file prior to visit.   Allergies:  Allergies  Allergen Reactions   Oxycodone Other (See Comments)    Hallucinations   Codeine    Medical History:  She has Hyperlipidemia; Migraine headache; GERD; IBS; Abnormal EKG; Overweight (BMI 25.0-29.9); Osteopenia; Personal history of colonic polyps; History of basal cell cancer; and Osteoarthritis of both knees on their problem list. Health Maintenance:   Immunization History  Administered Date(s) Administered   Influenza Inj Mdck Quad With Preservative 10/13/2017, 08/28/2018, 08/21/2019, 08/28/2020   Influenza Split 08/27/2014   Influenza,inj,Quad PF,6+ Mos 09/07/2021   Zoster Recombinat (Shingrix) 06/24/2021,  08/27/2021   Tetanus: 2014 Flu vaccine: 09/2021 Shingrix: 2/2 2022 Covid 19: declines   Pap: s/p hysterectomy MGM:06/05/2021 DEXA: 2019 - T -1.6 -has been stable since dx, defer for now  ECHO: 2012 -no significant abnormalities, EF 55-60%  Colonoscopy: 12/2020 due q7 colon polyp - Dr. Hilarie Fredrickson EGD: n/a  Last Dental Exam: Dr. Joya Martyr, last 2022, goes q77m Last Eye Exam: 2022, wears glasses/contacts Last Derm: Dr. Delman Cheadle, last visit few years ago, hx of R temple basal cell from birth mark   Patient Care Team: Unk Pinto, MD as PCP - General (Internal Medicine) Jari Pigg, MD as Consulting Physician (Dermatology) Minus Breeding, MD as Consulting Physician (Cardiology) Sable Feil, MD as Consulting Physician (Gastroenterology)  Surgical History:  She has a past surgical history that includes Vaginal hysterectomy; Tubal ligation; Oophorectomy; skin cancer removed; Polypectomy (11/09/2010); Colonoscopy (11/09/2010); Colonoscopy (2018); and Wisdom tooth extraction. Family History:  Herfamily history includes Arthritis in her father; Breast cancer in her maternal aunt and maternal aunt; Colitis in her brother; Diabetes in her father and mother; Heart attack in her maternal grandmother; Heart disease in her paternal grandfather; Heart disease (age of onset: 20) in her father; Hypertension in her mother; Kidney cancer in her father; Kidney disease in her father; Migraines in her paternal grandmother; Miscarriages / [place] in her mother; Other in her paternal grandmother; Pancreatic cancer in her paternal grandfather; Stroke in her father. Social History:  She reports that she quit smoking about 15 years ago. Her smoking use included cigarettes. She has a 10.00 pack-year smoking history. She has never used smokeless tobacco. She reports current alcohol use. She reports that she does not use drugs.   Review of Systems: Review of Systems  Constitutional:  Negative for  malaise/fatigue and weight loss.  HENT:  Negative for hearing loss and tinnitus.   Eyes:  Negative for blurred vision and double vision.  Respiratory:  Negative for cough, shortness of breath and wheezing.   Cardiovascular:  Negative for chest pain, palpitations, orthopnea, claudication and leg swelling.  Gastrointestinal:  Positive for diarrhea (Intermittent, associated with trigger foods). Negative for abdominal pain, blood in stool, constipation, heartburn, melena, nausea and vomiting.  Genitourinary: Negative.   Musculoskeletal:  Negative for joint pain and myalgias.  Skin:  Negative for rash.  Neurological:  Positive for headaches (Frequent sinus headaches; rare migraines). Negative for dizziness, tingling, sensory change and weakness.  Endo/Heme/Allergies:  Positive for environmental allergies. Negative for polydipsia.  Psychiatric/Behavioral: Negative.  Negative for  depression and substance abuse. The patient is not nervous/anxious and does not have insomnia.   All other systems reviewed and are negative.  Physical Exam: Estimated body mass index is 25.51 kg/m as calculated from the following:   Height as of this encounter: 5\' 3"  (1.6 m).   Weight as of this encounter: 144 lb (65.3 kg). BP 120/86    Pulse 70    Temp (!) 97.2 F (36.2 C)    Ht 5\' 3"  (1.6 m)    Wt 144 lb (65.3 kg)    SpO2 99%    BMI 25.51 kg/m  General Appearance: Well nourished, in no apparent distress.  Eyes: PERRLA, EOMs, conjunctiva no swelling or erythema Sinuses: No Frontal/maxillary tenderness  ENT/Mouth: Ext aud canals clear, normal light reflex with TMs without erythema, bulging. Good dentition. No erythema, swelling, or exudate on post pharynx. Tonsils not swollen or erythematous. Hearing normal.  Neck: Supple, thyroid normal. No bruits  Respiratory: Respiratory effort normal, BS equal bilaterally without rales, rhonchi, wheezing or stridor.  Cardio: RRR without murmurs, rubs or gallops. Brisk peripheral  pulses without edema.  Chest: symmetric, with normal excursions and percussion.  Breasts: breasts appear normal, no suspicious masses, no skin or nipple changes or axillary nodes. Abdomen: Soft, nontender, no guarding, rebound, hernias, masses, or organomegaly.  Lymphatics: Non tender without lymphadenopathy.  Genitourinary: Defered; no concerns; s/p total hysterectomy Musculoskeletal: Full ROM all peripheral extremities,5/5 strength, and normal gait.  Skin: Warm, dry without rashes, lesions, ecchymosis. Neuro: Cranial nerves intact, reflexes equal bilaterally. Normal muscle tone, no cerebellar symptoms. Sensation intact.  Psych: Awake and oriented X 3, normal affect, Insight and Judgment appropriate.   EKG: Stable from previous - no ST changes   Kelly Mclaughlin Kelly Mclaughlin 4:07 PM [facility]

## 2021-11-04 ENCOUNTER — Other Ambulatory Visit: Payer: Self-pay

## 2021-11-04 ENCOUNTER — Encounter: Payer: Self-pay | Admitting: Adult Health

## 2021-11-04 ENCOUNTER — Ambulatory Visit (INDEPENDENT_AMBULATORY_CARE_PROVIDER_SITE_OTHER): Payer: 59 | Admitting: Adult Health

## 2021-11-04 VITALS — BP 120/86 | HR 70 | Temp 97.2°F | Ht 63.0 in | Wt 144.0 lb

## 2021-11-04 DIAGNOSIS — Z79899 Other long term (current) drug therapy: Secondary | ICD-10-CM

## 2021-11-04 DIAGNOSIS — Z1329 Encounter for screening for other suspected endocrine disorder: Secondary | ICD-10-CM

## 2021-11-04 DIAGNOSIS — K219 Gastro-esophageal reflux disease without esophagitis: Secondary | ICD-10-CM

## 2021-11-04 DIAGNOSIS — Z136 Encounter for screening for cardiovascular disorders: Secondary | ICD-10-CM | POA: Diagnosis not present

## 2021-11-04 DIAGNOSIS — R9431 Abnormal electrocardiogram [ECG] [EKG]: Secondary | ICD-10-CM | POA: Diagnosis not present

## 2021-11-04 DIAGNOSIS — Z0001 Encounter for general adult medical examination with abnormal findings: Secondary | ICD-10-CM

## 2021-11-04 DIAGNOSIS — Z860100 Personal history of colon polyps, unspecified: Secondary | ICD-10-CM

## 2021-11-04 DIAGNOSIS — K580 Irritable bowel syndrome with diarrhea: Secondary | ICD-10-CM

## 2021-11-04 DIAGNOSIS — E663 Overweight: Secondary | ICD-10-CM

## 2021-11-04 DIAGNOSIS — E782 Mixed hyperlipidemia: Secondary | ICD-10-CM

## 2021-11-04 DIAGNOSIS — M8588 Other specified disorders of bone density and structure, other site: Secondary | ICD-10-CM

## 2021-11-04 DIAGNOSIS — E559 Vitamin D deficiency, unspecified: Secondary | ICD-10-CM

## 2021-11-04 DIAGNOSIS — M170 Bilateral primary osteoarthritis of knee: Secondary | ICD-10-CM

## 2021-11-04 DIAGNOSIS — Z131 Encounter for screening for diabetes mellitus: Secondary | ICD-10-CM

## 2021-11-04 DIAGNOSIS — G43909 Migraine, unspecified, not intractable, without status migrainosus: Secondary | ICD-10-CM

## 2021-11-04 DIAGNOSIS — Z1389 Encounter for screening for other disorder: Secondary | ICD-10-CM

## 2021-11-04 DIAGNOSIS — Z0000 Encounter for general adult medical examination without abnormal findings: Secondary | ICD-10-CM | POA: Diagnosis not present

## 2021-11-04 DIAGNOSIS — Z85828 Personal history of other malignant neoplasm of skin: Secondary | ICD-10-CM

## 2021-11-04 MED ORDER — FLUTICASONE PROPIONATE 50 MCG/ACT NA SUSP
1.0000 | Freq: Every day | NASAL | 2 refills | Status: DC
Start: 2021-11-04 — End: 2022-01-31

## 2021-11-04 MED ORDER — RIZATRIPTAN BENZOATE 10 MG PO TABS: 10.0000 mg | ORAL_TABLET | ORAL | 0 refills | Status: DC | PRN

## 2021-11-05 LAB — COMPLETE METABOLIC PANEL WITH GFR
AG Ratio: 2.5 (calc) (ref 1.0–2.5)
ALT: 29 U/L (ref 6–29)
AST: 19 U/L (ref 10–35)
Albumin: 4.7 g/dL (ref 3.6–5.1)
Alkaline phosphatase (APISO): 70 U/L (ref 37–153)
BUN: 18 mg/dL (ref 7–25)
CO2: 21 mmol/L (ref 20–32)
Calcium: 9.5 mg/dL (ref 8.6–10.4)
Chloride: 105 mmol/L (ref 98–110)
Creat: 0.71 mg/dL (ref 0.50–1.03)
Globulin: 1.9 g/dL (calc) (ref 1.9–3.7)
Glucose, Bld: 91 mg/dL (ref 65–99)
Potassium: 3.9 mmol/L (ref 3.5–5.3)
Sodium: 139 mmol/L (ref 135–146)
Total Bilirubin: 0.4 mg/dL (ref 0.2–1.2)
Total Protein: 6.6 g/dL (ref 6.1–8.1)
eGFR: 100 mL/min/{1.73_m2} (ref >=60)

## 2021-11-05 LAB — URINALYSIS, ROUTINE W REFLEX MICROSCOPIC
Bilirubin Urine: NEGATIVE
Glucose, UA: NEGATIVE
Hgb urine dipstick: NEGATIVE
Ketones, ur: NEGATIVE
Leukocytes,Ua: NEGATIVE
Nitrite: NEGATIVE
Protein, ur: NEGATIVE
Specific Gravity, Urine: 1.005 (ref 1.001–1.035)
pH: 6.5 (ref 5.0–8.0)

## 2021-11-05 LAB — LIPID PANEL
Cholesterol: 188 mg/dL (ref <200)
HDL: 53 mg/dL (ref >=50)
LDL Cholesterol (Calc): 118 mg/dL (calc) — ABNORMAL HIGH
Non-HDL Cholesterol (Calc): 135 mg/dL (calc) — ABNORMAL HIGH (ref <130)
Total CHOL/HDL Ratio: 3.5 (calc) (ref <5.0)
Triglycerides: 80 mg/dL (ref <150)

## 2021-11-05 LAB — CBC WITH DIFFERENTIAL/PLATELET
Absolute Monocytes: 250 cells/uL (ref 200–950)
Basophils Absolute: 0 cells/uL (ref 0–200)
Basophils Relative: 0 %
Eosinophils Absolute: 10 cells/uL — ABNORMAL LOW (ref 15–500)
Eosinophils Relative: 0.1 %
HCT: 37.3 % (ref 35.0–45.0)
Hemoglobin: 12.9 g/dL (ref 11.7–15.5)
Lymphs Abs: 720 cells/uL — ABNORMAL LOW (ref 850–3900)
MCH: 30.4 pg (ref 27.0–33.0)
MCHC: 34.6 g/dL (ref 32.0–36.0)
MCV: 87.8 fL (ref 80.0–100.0)
MPV: 10.7 fL (ref 7.5–12.5)
Monocytes Relative: 2.5 %
Neutro Abs: 9020 cells/uL — ABNORMAL HIGH (ref 1500–7800)
Neutrophils Relative %: 90.2 %
Platelets: 304 10*3/uL (ref 140–400)
RBC: 4.25 10*6/uL (ref 3.80–5.10)
RDW: 13.9 % (ref 11.0–15.0)
Total Lymphocyte: 7.2 %
WBC: 10 10*3/uL (ref 3.8–10.8)

## 2021-11-18 ENCOUNTER — Other Ambulatory Visit: Payer: Self-pay | Admitting: Adult Health

## 2021-11-18 MED ORDER — METHOCARBAMOL 500 MG PO TABS: 500.0000 mg | ORAL_TABLET | Freq: Four times a day (QID) | ORAL | 0 refills | Status: AC | PRN

## 2021-11-20 ENCOUNTER — Ambulatory Visit
Admission: RE | Admit: 2021-11-20 | Discharge: 2021-11-20 | Disposition: A | Discharge status: Home / Self Care | Payer: 59 | Source: Ambulatory Visit | Attending: Adult Health | Admitting: Adult Health

## 2021-11-20 DIAGNOSIS — M8588 Other specified disorders of bone density and structure, other site: Secondary | ICD-10-CM

## 2021-11-21 ENCOUNTER — Other Ambulatory Visit: Payer: Self-pay | Admitting: Adult Health

## 2021-11-21 ENCOUNTER — Encounter: Payer: Self-pay | Admitting: Adult Health

## 2021-11-21 DIAGNOSIS — M810 Age-related osteoporosis without current pathological fracture: Secondary | ICD-10-CM

## 2021-11-21 MED ORDER — ALENDRONATE SODIUM 70 MG PO TABS: 70.0000 mg | ORAL_TABLET | ORAL | 3 refills | Status: DC

## 2021-12-02 ENCOUNTER — Other Ambulatory Visit: Payer: Self-pay | Admitting: Adult Health

## 2021-12-14 ENCOUNTER — Other Ambulatory Visit: Payer: Self-pay | Admitting: Internal Medicine

## 2021-12-14 DIAGNOSIS — E119 Type 2 diabetes mellitus without complications: Secondary | ICD-10-CM

## 2021-12-14 MED ORDER — TIRZEPATIDE 7.5 MG/0.5ML ~~LOC~~ SOAJ: SUBCUTANEOUS | 1 refills | Status: DC

## 2021-12-15 ENCOUNTER — Other Ambulatory Visit: Payer: Self-pay

## 2021-12-15 ENCOUNTER — Other Ambulatory Visit: Payer: Self-pay | Admitting: Internal Medicine

## 2021-12-15 DIAGNOSIS — E119 Type 2 diabetes mellitus without complications: Secondary | ICD-10-CM

## 2021-12-15 MED ORDER — TIRZEPATIDE 7.5 MG/0.5ML ~~LOC~~ SOAJ: SUBCUTANEOUS | 1 refills | Status: DC

## 2022-01-31 ENCOUNTER — Other Ambulatory Visit: Payer: Self-pay | Admitting: Adult Health

## 2022-03-17 ENCOUNTER — Other Ambulatory Visit: Payer: Self-pay | Admitting: Internal Medicine

## 2022-04-13 ENCOUNTER — Other Ambulatory Visit: Payer: Self-pay | Admitting: Adult Health

## 2022-04-18 ENCOUNTER — Other Ambulatory Visit: Payer: Self-pay | Admitting: Nurse Practitioner

## 2022-04-27 ENCOUNTER — Encounter: Payer: Self-pay | Admitting: Internal Medicine

## 2022-04-27 ENCOUNTER — Ambulatory Visit (INDEPENDENT_AMBULATORY_CARE_PROVIDER_SITE_OTHER): Payer: 59 | Admitting: Internal Medicine

## 2022-04-27 VITALS — BP 122/78 | HR 69 | Ht 63.0 in | Wt 123.8 lb

## 2022-04-27 DIAGNOSIS — K580 Irritable bowel syndrome with diarrhea: Secondary | ICD-10-CM | POA: Diagnosis not present

## 2022-05-07 ENCOUNTER — Other Ambulatory Visit: Payer: Self-pay | Admitting: Internal Medicine

## 2022-05-07 DIAGNOSIS — Z1231 Encounter for screening mammogram for malignant neoplasm of breast: Secondary | ICD-10-CM

## 2022-05-26 ENCOUNTER — Encounter: Payer: Self-pay | Admitting: Internal Medicine

## 2022-05-26 ENCOUNTER — Ambulatory Visit (INDEPENDENT_AMBULATORY_CARE_PROVIDER_SITE_OTHER): Payer: 59 | Admitting: Internal Medicine

## 2022-05-26 VITALS — BP 115/85 | HR 68 | Temp 96.9°F | Ht 63.0 in | Wt 124.8 lb

## 2022-05-26 DIAGNOSIS — M533 Sacrococcygeal disorders, not elsewhere classified: Secondary | ICD-10-CM

## 2022-05-26 DIAGNOSIS — M545 Low back pain, unspecified: Secondary | ICD-10-CM | POA: Diagnosis not present

## 2022-05-26 NOTE — Progress Notes (Signed)
Future Appointments  Date Time Provider Department  06/11/2022  1:10 PM [facility] 2 [facility]  11/04/2022  3:00 PM [place], Kenney Houseman, NP [facility]   History of Present Illness:     Patient is a very nice 56 yo MWF presenting with c/o low back pain for several months.  6-7 months ago in Jan 2023 , de[facility]  found osteoporosis with T-2.5 & patient was started on Fosamax.  She reports discomfort from the mid to low thoracic para spinous areas down to the low lumbar para spinous areas. She also reports pains in the tailbone (Coccyx ) area exacerbated by sitting on a "hard" surface.   Medications  Current Outpatient Medications (Endocrine & Metabolic):    alendronate (FOSAMAX) 70 MG tablet, Take 1 tablet (70 mg total) by mouth every 7 (seven) days. Take with a full glass of water on an empty stomach and avoid lying down or reclining for 30 min after taking.   tirzepatide (MOUNJARO) 7.5 MG/0.5ML Pen, Inject 7.5 mg into skin once weekly for Diabetes (Dx: e11.9)   Current Outpatient Medications (Respiratory):    Cetirizine HCl (ZYRTEC ALLERGY PO), Take 1 tablet by mouth daily.   pseudoephedrine (SUDAFED) 120 MG 12 hr tablet, Take  1 tablet  2 x /day (every 12 hours)  for Sinus & Chest Congestion (Patient not taking: Reported on 05/26/2022)  Current Outpatient Medications (Analgesics):    meloxicam (MOBIC) 15 MG tablet, Take 1 tablet (15 mg total) by mouth daily. (Patient taking differently: Take 15 mg by mouth daily as needed.)   Rimegepant Sulfate (NURTEC) 75 MG TBDP, Take 1 tablet by mouth as needed (for headaches).   rizatriptan (MAXALT) 10 MG tablet, TAKE 1 TABLET BY MOUTH AS NEEDED FOR MIGRAINE. MAY REPEAT IN 2 HOURS IF NEEDED   Current Outpatient Medications (Other):    Ascorbic Acid (VITAMIN C PO), Take 1,000 mg by mouth daily.    Calcium Carb-Cholecalciferol (CALCIUM + D3) 600-200 MG-UNIT TABS, Take 1,200 Units by mouth daily.   Cholecalciferol (VITAMIN D3) 125 MCG (5000 UT) TABS, Take 1  tablet by mouth daily.   diphenoxylate-atropine (LOMOTIL) 2.5-0.025 MG tablet, Take 1 tablet by mouth 3 (three) times daily as needed for diarrhea or loose stools.   MAGNESIUM PO, Take 250 mg by mouth daily.    methocarbamol (ROBAXIN) 500 MG tablet, Take 1 tablet (500 mg total) by mouth every 6 (six) hours as needed for muscle spasms. Do not take if will be driving.   ondansetron (ZOFRAN) 8 MG tablet, TAKE 1/2 TO 1 TABLET EVERY 6 TO 8 HOURS AS NEEDED FOR NAUSEA / VOMITING   topiramate (TOPAMAX) 100 MG tablet, TAKE 2 TABLETS AT BEDTIME FOR MIGRAINE PREVENTION & WEIGHT LOSS   zinc gluconate 50 MG tablet, Take 50 mg by mouth daily.  Problem list She has Hyperlipidemia; Migraine headache; GERD; IBS; Abnormal EKG; Overweight (BMI 25.0-29.9); Osteoporosis; Personal history of colonic polyps; History of basal cell cancer; and Osteoarthritis of both knees on their problem list.   Observations/Objective:  BP 115/85   Pulse 68   Temp (!) 96.9 F (36.1 C)   Ht '5\' 3"'$  (1.6 m)   Wt 124 lb 12.8 oz (56.6 kg)   SpO2 99%   BMI 22.11 kg/m   HEENT - WNL. Neck - supple.  Chest - Clear equal BS. Cor - Nl HS. RRR w/o sig MGR. PP 1(+). No edema. MS- FROM w/o deformities.         Tender para spinous areas  from the mid/lower thoracic areas to         the low lumbar area and also point tenderness of the coccyx.         Gait Nl. Nl hip ROM & fig 4. Neg SLR.         No trochanteric or ischial bursal tenderness.  Neuro -  Nl w/o focal abnormalities.   Assessment and Plan:   1. Lumbar pain  - DG Lumbar Spine Complete; Future  2. Coccydynia  - DG Sacrum/Coccyx; Future  - Pending Xray results, recommend take gTylenol 1,000 mg  3-4 x /day w/ meals & bedtime     Follow Up Instructions:        I discussed the assessment and treatment plan with the patient. The patient was provided an opportunity to ask questions and all were answered. The patient agreed with the plan and demonstrated an  understanding of the instructions.       The patient was advised to call back or seek an in-person evaluation if the symptoms worsen or if the condition fails to improve as anticipated.    Kirtland Bouchard, MD

## 2022-05-28 ENCOUNTER — Ambulatory Visit
Admission: RE | Admit: 2022-05-28 | Discharge: 2022-05-28 | Disposition: A | Discharge status: Home / Self Care | Payer: 59 | Source: Ambulatory Visit | Attending: Internal Medicine | Admitting: Internal Medicine

## 2022-05-28 DIAGNOSIS — M533 Sacrococcygeal disorders, not elsewhere classified: Secondary | ICD-10-CM

## 2022-05-28 DIAGNOSIS — M545 Low back pain, unspecified: Secondary | ICD-10-CM

## 2022-05-30 NOTE — Progress Notes (Signed)
-   No sign of Vertebral Compression Fracture - Great  !   - Mild arthritic changes at L5-S1  - Sacrum & Coccyx   (  tailbone ) - Unremarkable

## 2022-05-30 NOTE — Progress Notes (Signed)
-   No sign of Vertebral Compression Fracture   - Mild arthritic changes at L5-S1  - Sacrum & Coccyx   (  tailbone ) - Unremarkable

## 2022-05-31 ENCOUNTER — Other Ambulatory Visit: Payer: Self-pay | Admitting: Internal Medicine

## 2022-05-31 DIAGNOSIS — M545 Low back pain, unspecified: Secondary | ICD-10-CM

## 2022-05-31 DIAGNOSIS — M533 Sacrococcygeal disorders, not elsewhere classified: Secondary | ICD-10-CM

## 2022-05-31 MED ORDER — PREDNISONE 5 MG PO TABS: ORAL_TABLET | ORAL | 0 refills | Status: DC

## 2022-05-31 NOTE — Progress Notes (Signed)
-   Discussed Xray reports with patient showing mild DJD.   Patient reports pains are limiting in ADL.   Discussed trial on low dose steroid taper for 2 weeks &   if beneficial then trial with Meloxicam   - bmck

## 2022-06-11 ENCOUNTER — Ambulatory Visit
Admission: RE | Admit: 2022-06-11 | Discharge: 2022-06-11 | Disposition: A | Discharge status: Home / Self Care | Payer: 59 | Source: Ambulatory Visit | Attending: Internal Medicine | Admitting: Internal Medicine

## 2022-06-11 DIAGNOSIS — Z1231 Encounter for screening mammogram for malignant neoplasm of breast: Secondary | ICD-10-CM

## 2022-09-20 ENCOUNTER — Other Ambulatory Visit: Payer: Self-pay | Admitting: Nurse Practitioner

## 2022-09-20 MED ORDER — RIZATRIPTAN BENZOATE 10 MG PO TABS: ORAL_TABLET | ORAL | 5 refills | Status: DC

## 2022-10-15 ENCOUNTER — Other Ambulatory Visit: Payer: Self-pay | Admitting: Internal Medicine

## 2022-11-04 ENCOUNTER — Ambulatory Visit (INDEPENDENT_AMBULATORY_CARE_PROVIDER_SITE_OTHER): Payer: 59 | Admitting: Nurse Practitioner

## 2022-11-04 VITALS — BP 126/80 | HR 63 | Temp 97.2°F | Ht 62.75 in | Wt 119.4 lb

## 2022-11-04 DIAGNOSIS — Z136 Encounter for screening for cardiovascular disorders: Secondary | ICD-10-CM | POA: Diagnosis not present

## 2022-11-04 DIAGNOSIS — E559 Vitamin D deficiency, unspecified: Secondary | ICD-10-CM

## 2022-11-04 DIAGNOSIS — Z1329 Encounter for screening for other suspected endocrine disorder: Secondary | ICD-10-CM

## 2022-11-04 DIAGNOSIS — M8588 Other specified disorders of bone density and structure, other site: Secondary | ICD-10-CM

## 2022-11-04 DIAGNOSIS — Z79899 Other long term (current) drug therapy: Secondary | ICD-10-CM

## 2022-11-04 DIAGNOSIS — K580 Irritable bowel syndrome with diarrhea: Secondary | ICD-10-CM

## 2022-11-04 DIAGNOSIS — E663 Overweight: Secondary | ICD-10-CM

## 2022-11-04 DIAGNOSIS — E782 Mixed hyperlipidemia: Secondary | ICD-10-CM

## 2022-11-04 DIAGNOSIS — Z130 Encounter for screening for diseases of the blood and blood-forming organs and certain disorders involving the immune mechanism: Secondary | ICD-10-CM

## 2022-11-04 DIAGNOSIS — G43909 Migraine, unspecified, not intractable, without status migrainosus: Secondary | ICD-10-CM

## 2022-11-04 DIAGNOSIS — I10 Essential (primary) hypertension: Secondary | ICD-10-CM | POA: Diagnosis not present

## 2022-11-04 DIAGNOSIS — Z9109 Other allergy status, other than to drugs and biological substances: Secondary | ICD-10-CM

## 2022-11-04 DIAGNOSIS — M170 Bilateral primary osteoarthritis of knee: Secondary | ICD-10-CM

## 2022-11-04 DIAGNOSIS — Z131 Encounter for screening for diabetes mellitus: Secondary | ICD-10-CM

## 2022-11-04 DIAGNOSIS — Z0000 Encounter for general adult medical examination without abnormal findings: Secondary | ICD-10-CM | POA: Diagnosis not present

## 2022-11-04 DIAGNOSIS — Z1389 Encounter for screening for other disorder: Secondary | ICD-10-CM

## 2022-11-04 DIAGNOSIS — Z0001 Encounter for general adult medical examination with abnormal findings: Secondary | ICD-10-CM

## 2022-11-04 DIAGNOSIS — Z86010 Personal history of colonic polyps: Secondary | ICD-10-CM

## 2022-11-04 DIAGNOSIS — K219 Gastro-esophageal reflux disease without esophagitis: Secondary | ICD-10-CM

## 2022-11-04 MED ORDER — ONDANSETRON HCL 8 MG PO TABS: ORAL_TABLET | ORAL | 3 refills | Status: AC

## 2022-11-04 NOTE — Patient Instructions (Addendum)
Zep bound\

## 2022-11-04 NOTE — Progress Notes (Unsigned)
Complete Physical  Assessment and Plan:  Diagnoses and all orders for this visit:  Encounter for Annual Physical Exam with abnormal findings Due annually  Health Maintenance reviewed Healthy lifestyle reviewed and goals set Continue annual mammogram via breast center   Migraine without status migrainosus, not intractable, unspecified migraine type Continue topamax daily as prophylaxis Continue daily antihistamine, flonase Start Singulair   Gastroesophageal reflux disease, esophagitis presence not specified No suspected reflux complications (Barret/stricture). Lifestyle modification:  wt loss, avoid meals 2-3h before bedtime. Consider eliminating food triggers:  chocolate, caffeine, EtOH, acid/spicy food.   Irritable bowel syndrome, with diarrhea Avoidance of triggers (fastfoods, etc) And PRN lomotil   Mixed hyperlipidemia Discussed lifestyle modifications. Recommended diet heavy in fruits and veggies, omega 3's. Decrease consumption of animal meats, cheeses, and dairy products. Remain active and exercise as tolerated. Continue to monitor. Check lipids/TSH   Overweight (BMI 25.0-29.9) Discussed appropriate BMI Diet modification. Physical activity. Encouraged/praised to build confidence.   Screening for cardiovascular condition/Hx of abnormal EKG History of abnormal EKG; stable Has seen cardiology with normal ECHO 2012 Monitor   History of colon polyps UTD No new issues reported. 7 year follow up per Dr. Hilarie Fredrickson, due 2029  Screening for hematuria or proteinuria -     Urinalysis, Complete (81001)  Vitamin D deficiency -     VITAMIN D 25 Hydroxy (Vit-D Deficiency, Fractures) - defer, requesting minimal labs, no recent dose changes  Medication management All medications discussed and reviewed in full. All questions and concerns regarding medications addressed.     Bilateral knee pain Ortho Murphy/Wainer is following; syncvasc injection minimally  beneficial meloxicam PRN, steroid tapers helpful when needed  Osteopenia Continue Alendronate Pursue a combination of weight-bearing exercises and strength training. Advised on fall prevention measures including proper lighting in all rooms, removal of area rugs and floor clutter, use of walking devices as deemed appropriate, avoidance of uneven walking surfaces. Smoking cessation and moderate alcohol consumption if applicable Consume 161 to 1000 IU of vitamin D daily with a goal vitamin D serum value of 30 ng/mL or higher. Aim for 1000 to 1200 mg of elemental calcium daily through supplements and/or dietary sources.  Environmental allergies Start Singulair as directed. Continue Flonase. Avoid triggers. Possible referral to Asthma & Allergy Specialist if s/s fail to improve.     Orders Placed This Encounter  Procedures   CBC with Differential/Platelet   COMPLETE METABOLIC PANEL WITH GFR   Lipid panel   TSH   Hemoglobin A1c   Insulin, random   VITAMIN D 25 Hydroxy (Vit-D Deficiency, Fractures)   Urinalysis, Routine w reflex microscopic   Microalbumin / creatinine urine ratio   Iron, TIBC and Ferritin Panel   Iron, TIBC and Ferritin Panel   EKG 12-Lead   Meds ordered this encounter  Medications   ondansetron (ZOFRAN) 8 MG tablet    Sig: TAKE 1/2 TO 1 TABLET EVERY 6 TO 8 HOURS AS NEEDED FOR NAUSEA / VOMITING    Dispense:  30 tablet    Refill:  3   montelukast (SINGULAIR) 10 MG tablet    Sig: Take 1 tablet daily for Allergies & Asthma    Dispense:  90 tablet    Refill:  3    Order Specific Question:   Supervising Provider    Answer:   Unk Pinto [phone]    Notify office for further evaluation and treatment, questions or concerns if any reported s/s fail to improve.   The patient was advised to call  back or seek an in-person evaluation if any symptoms worsen or if the condition fails to improve as anticipated.   Further disposition pending results of labs. Discussed  med's effects and SE's.    I discussed the assessment and treatment plan with the patient. The patient was provided an opportunity to ask questions and all were answered. The patient agreed with the plan and demonstrated an understanding of the instructions.  Discussed med's effects and SE's. Screening labs and tests as requested with regular follow-up as recommended.  I provided 35 minutes of face-to-face time during this encounter including counseling, chart review, and critical decision making was preformed.  Future Appointments  Date Time Provider [place]  11/07/2023 10:00 AM Darrol Jump, NP [facility] None    HPI  57 y.o. female  presents for a complete physical and follow up for has Hyperlipidemia; Migraine headache; GERD; IBS; Abnormal EKG; Overweight (BMI 25.0-29.9); Osteoporosis; Personal history of colonic polyps; History of basal cell cancer; and Osteoarthritis of both knees on their problem list.   Overall she reports feeling well.    She is married, 2 kids, 4 grand kids. She is phlebotomist at our office.   Mood is improved some, was really down after covid 19 infection, has improved, getting away more.  She continues to have sinus headaches - improved by daily allergy medication, regular use of flonase. She reports 3-4 migraines a year for which she is prescribed topamax 50 mg as prophylaxis and nurtec as abortive; was doing better with injectable triptan but insurance stopped covering. Has failed imitrex in the past.   Notices increase in allergy symptoms which can trigger migraine.  She uses Flonase with some relief and switches OTC antihistamines, however, allergies have increasingly bothered her.  Most notable high smelling perfumes.    She has bilateral knee/foot pain, sees now following with Murphy/Wainer, mod to severe R knee tricompartmental arthritis, had synvasc injection 12/2019 but reports minimal improvement. Taking meloxicam PRN, steroid spurts are  very helpful if needed.   GERD is rare, takes a tums if needed. IBS on lomotil doing fairly.   BMI is Body mass index is 21.32 kg/m., she has been working on diet, exercise limited by ortho conditions.  Wt Readings from Last 3 Encounters:  11/04/22 119 lb 6.4 oz (54.2 kg)  05/26/22 124 lb 12.8 oz (56.6 kg)  04/27/22 123 lb 12.8 oz (56.2 kg)   She had abnormal EKG, saw Dr. Percival Spanish in 2012 with unremarkable ECHO.  Today their BP is BP: 126/80 She does not workout. She denies chest pain, shortness of breath, dizziness.   She is not on cholesterol medication and denies myalgias. Her cholesterol is not at goal. The cholesterol last visit was:   Lab Results  Component Value Date   CHOL 188 11/04/2021   HDL 53 11/04/2021   LDLCALC 118 (H) 11/04/2021   TRIG 80 11/04/2021   CHOLHDL 3.5 11/04/2021    Last A1C in the office was:  Lab Results  Component Value Date   HGBA1C 5.3 11/03/2020   Last GFR: Lab Results  Component Value Date   GFRNONAA 68 11/03/2020   Patient is on Vitamin D supplement and at goal, 5000 IU:    Lab Results  Component Value Date   VD25OH 90 11/03/2020        Current Medications:  Current Outpatient Medications on File Prior to Visit  Medication Sig Dispense Refill   alendronate (FOSAMAX) 70 MG tablet Take 1 tablet (70 mg total)  by mouth every 7 (seven) days. Take with a full glass of water on an empty stomach and avoid lying down or reclining for 30 min after taking. 12 tablet 3   Ascorbic Acid (VITAMIN C PO) Take 1,000 mg by mouth daily.      Calcium Carb-Cholecalciferol (CALCIUM + D3) 600-200 MG-UNIT TABS Take 1,200 Units by mouth daily.     Cholecalciferol (VITAMIN D3) 125 MCG (5000 UT) TABS Take 1 tablet by mouth daily.     diphenoxylate-atropine (LOMOTIL) 2.5-0.025 MG tablet Take 1 tablet by mouth 3 (three) times daily as needed for diarrhea or loose stools. 90 tablet 1   MAGNESIUM PO Take 250 mg by mouth daily.      meloxicam (MOBIC) 15 MG tablet  Take 1 tablet (15 mg total) by mouth daily. (Patient taking differently: Take 15 mg by mouth daily as needed.) 90 tablet 0   methocarbamol (ROBAXIN) 500 MG tablet Take 1 tablet (500 mg total) by mouth every 6 (six) hours as needed for muscle spasms. Do not take if will be driving. 60 tablet 0   ondansetron (ZOFRAN) 8 MG tablet TAKE 1/2 TO 1 TABLET EVERY 6 TO 8 HOURS AS NEEDED FOR NAUSEA / VOMITING 18 tablet 5   rizatriptan (MAXALT) 10 MG tablet TAKE 1 TABLET BY MOUTH AS NEEDED FOR MIGRAINE. MAY REPEAT IN 2 HOURS IF NEEDED 10 tablet 5   topiramate (TOPAMAX) 100 MG tablet TAKE 2 TABLETS AT BEDTIME FOR MIGRAINE PREVENTION & WEIGHT LOSS 180 tablet 1   zinc gluconate 50 MG tablet Take 50 mg by mouth daily.     Cetirizine HCl (ZYRTEC ALLERGY PO) Take 1 tablet by mouth daily. (Patient not taking: Reported on 11/04/2022)     predniSONE (DELTASONE) 5 MG tablet Take 1 tablet 3 x /day for 5 days, then 2 x /day for 5 days, then 1 x /day for 5 days (Patient not taking: Reported on 11/04/2022) 30 tablet 0   pseudoephedrine (SUDAFED) 120 MG 12 hr tablet Take  1 tablet  2 x /day (every 12 hours)  for Sinus & Chest Congestion (Patient not taking: Reported on 05/26/2022) 60 tablet 3   Rimegepant Sulfate (NURTEC) 75 MG TBDP Take 1 tablet by mouth as needed (for headaches). (Patient not taking: Reported on 11/04/2022)     tirzepatide [facility]) 7.5 MG/0.5ML Pen Inject 7.5 mg into skin once weekly for Diabetes (Dx: e11.9) (Patient not taking: Reported on 11/04/2022) 6 mL 1   No current facility-administered medications on file prior to visit.   Allergies:  Allergies  Allergen Reactions   Oxycodone Other (See Comments)    Hallucinations   Codeine    Dexamethasone    Medical History:  She has Hyperlipidemia; Migraine headache; GERD; IBS; Abnormal EKG; Overweight (BMI 25.0-29.9); Osteoporosis; Personal history of colonic polyps; History of basal cell cancer; and Osteoarthritis of both knees on their problem list. Health  Maintenance:   Immunization History  Administered Date(s) Administered   Influenza Inj Mdck Quad With Preservative 10/13/2017, 08/28/2018, 08/21/2019, 08/28/2020   Influenza Split 08/27/2014   Influenza,inj,Quad PF,6+ Mos 09/07/2021   Zoster Recombinat (Shingrix) 06/24/2021, 08/27/2021   Tetanus: 2014 Flu vaccine: declines Shingrix: 2/2 2022 Covid 19: declines   Pap: s/p hysterectomy MGM:06/11/22 DEXA: 2019 - T -1.6 11/2021 -2.5 Osteoporotic Due 11/2023 - Aldendronate ECHO: 2012 -no significant abnormalities, EF 55-60%  Colonoscopy: 12/2020 due q7 colon polyp - Dr. Hilarie Fredrickson Due 2029 EGD: n/a  Last Dental Exam: Dr. Juleen China & Phillip Heal, last 2023, goes  q16mLast Eye Exam: 07/2022, wears glasses/contacts Last Derm: Dr. GDelman Cheadle last visit few years ago, hx of R temple basal cell from birth mark   Patient Care Team: MUnk Pinto MD as PCP - General (Internal Medicine) GJari Pigg MD as Consulting Physician (Dermatology) HMinus Breeding MD as Consulting Physician (Cardiology) PSable Feil MD as Consulting Physician (Gastroenterology)  Surgical History:  She has a past surgical history that includes Vaginal hysterectomy; Tubal ligation; Oophorectomy; skin cancer removed; Polypectomy (11/09/2010); Colonoscopy (11/09/2010); Colonoscopy (2018); and Wisdom tooth extraction. Family History:  Herfamily history includes Arthritis in her father; Breast cancer in her maternal aunt and maternal aunt; Colitis in her brother; Diabetes in her father and mother; Heart attack in her maternal grandmother; Heart disease in her paternal grandfather; Heart disease (age of onset: 550 in her father; Hypertension in her mother; Kidney cancer in her father; Kidney disease in her father; Migraines in her paternal grandmother; Miscarriages / SKoreain her mother; Other in her paternal grandmother; Pancreatic cancer in her paternal grandfather; Stroke in her father. Social History:  She reports that she quit  smoking about 16 years ago. Her smoking use included cigarettes. She has a 10.00 pack-year smoking history. She has never used smokeless tobacco. She reports current alcohol use. She reports that she does not use drugs.   Review of Systems: Review of Systems  Constitutional:  Negative for malaise/fatigue and weight loss.  HENT:  Negative for hearing loss and tinnitus.   Eyes:  Negative for blurred vision and double vision.  Respiratory:  Negative for cough, shortness of breath and wheezing.   Cardiovascular:  Negative for chest pain, palpitations, orthopnea, claudication and leg swelling.  Gastrointestinal:  Positive for diarrhea (Intermittent, associated with trigger foods). Negative for abdominal pain, blood in stool, constipation, heartburn, melena, nausea and vomiting.  Genitourinary: Negative.   Musculoskeletal:  Negative for joint pain and myalgias.  Skin:  Negative for rash.  Neurological:  Positive for headaches (Frequent sinus headaches; rare migraines). Negative for dizziness, tingling, sensory change and weakness.  Endo/Heme/Allergies:  Positive for environmental allergies. Negative for polydipsia.  Psychiatric/Behavioral: Negative.  Negative for depression and substance abuse. The patient is not nervous/anxious and does not have insomnia.   All other systems reviewed and are negative.   Physical Exam: Estimated body mass index is 21.32 kg/m as calculated from the following:   Height as of this encounter: 5' 2.75" (1.594 m).   Weight as of this encounter: 119 lb 6.4 oz (54.2 kg). BP 126/80   Pulse 63   Temp (!) 97.2 F (36.2 C)   Ht 5' 2.75" (1.594 m)   Wt 119 lb 6.4 oz (54.2 kg)   SpO2 94%   BMI 21.32 kg/m  General Appearance: Well nourished, in no apparent distress.  Eyes: PERRLA, EOMs, conjunctiva no swelling or erythema Sinuses: No Frontal/maxillary tenderness  ENT/Mouth: Ext aud canals clear, normal light reflex with TMs without erythema, bulging. Good dentition.  No erythema, swelling, or exudate on post pharynx. Tonsils not swollen or erythematous. Hearing normal.  Neck: Supple, thyroid normal. No bruits  Respiratory: Respiratory effort normal, BS equal bilaterally without rales, rhonchi, wheezing or stridor.  Cardio: RRR without murmurs, rubs or gallops. Brisk peripheral pulses without edema.  Chest: symmetric, with normal excursions and percussion.  Breasts: breasts appear normal, no suspicious masses, no skin or nipple changes or axillary nodes. Abdomen: Soft, nontender, no guarding, rebound, hernias, masses, or organomegaly.  Lymphatics: Non tender without lymphadenopathy.  Genitourinary: Defered; no  concerns; s/p total hysterectomy Musculoskeletal: Full ROM all peripheral extremities,5/5 strength, and normal gait.  Skin: Warm, dry without rashes, lesions, ecchymosis. Neuro: Cranial nerves intact, reflexes equal bilaterally. Normal muscle tone, no cerebellar symptoms. Sensation intact.  Psych: Awake and oriented X 3, normal affect, Insight and Judgment appropriate.   EKG: Stable from previous - no ST changes   Kysen Wetherington 3:08 PM [facility]

## 2022-11-05 LAB — COMPLETE METABOLIC PANEL WITH GFR
AG Ratio: 2.3 (calc) (ref 1.0–2.5)
ALT: 21 U/L (ref 6–29)
AST: 20 U/L (ref 10–35)
Albumin: 4.5 g/dL (ref 3.6–5.1)
Alkaline phosphatase (APISO): 59 U/L (ref 37–153)
BUN: 10 mg/dL (ref 7–25)
CO2: 25 mmol/L (ref 20–32)
Calcium: 9.2 mg/dL (ref 8.6–10.4)
Chloride: 107 mmol/L (ref 98–110)
Creat: 0.6 mg/dL (ref 0.50–1.03)
Globulin: 2 g/dL (calc) (ref 1.9–3.7)
Glucose, Bld: 78 mg/dL (ref 65–99)
Potassium: 3.7 mmol/L (ref 3.5–5.3)
Sodium: 141 mmol/L (ref 135–146)
Total Bilirubin: 0.5 mg/dL (ref 0.2–1.2)
Total Protein: 6.5 g/dL (ref 6.1–8.1)
eGFR: 105 mL/min/{1.73_m2} (ref >=60)

## 2022-11-05 LAB — CBC WITH DIFFERENTIAL/PLATELET
Absolute Monocytes: 261 cells/uL (ref 200–950)
Basophils Absolute: 41 cells/uL (ref 0–200)
Basophils Relative: 0.7 %
Eosinophils Absolute: 122 cells/uL (ref 15–500)
Eosinophils Relative: 2.1 %
HCT: 36.5 % (ref 35.0–45.0)
Hemoglobin: 12.6 g/dL (ref 11.7–15.5)
Lymphs Abs: 2163 cells/uL (ref 850–3900)
MCH: 31 pg (ref 27.0–33.0)
MCHC: 34.5 g/dL (ref 32.0–36.0)
MCV: 89.7 fL (ref 80.0–100.0)
MPV: 10.4 fL (ref 7.5–12.5)
Monocytes Relative: 4.5 %
Neutro Abs: 3213 cells/uL (ref 1500–7800)
Neutrophils Relative %: 55.4 %
Platelets: 196 10*3/uL (ref 140–400)
RBC: 4.07 10*6/uL (ref 3.80–5.10)
RDW: 11.9 % (ref 11.0–15.0)
Total Lymphocyte: 37.3 %
WBC: 5.8 10*3/uL (ref 3.8–10.8)

## 2022-11-05 LAB — URINALYSIS, ROUTINE W REFLEX MICROSCOPIC
Bilirubin Urine: NEGATIVE
Glucose, UA: NEGATIVE
Hgb urine dipstick: NEGATIVE
Ketones, ur: NEGATIVE
Leukocytes,Ua: NEGATIVE
Nitrite: NEGATIVE
Protein, ur: NEGATIVE
Specific Gravity, Urine: 1.003 (ref 1.001–1.035)
pH: 7.5 (ref 5.0–8.0)

## 2022-11-05 LAB — LIPID PANEL
Cholesterol: 193 mg/dL (ref <200)
HDL: 75 mg/dL (ref >=50)
LDL Cholesterol (Calc): 104 mg/dL (calc) — ABNORMAL HIGH
Non-HDL Cholesterol (Calc): 118 mg/dL (calc) (ref <130)
Total CHOL/HDL Ratio: 2.6 (calc) (ref <5.0)
Triglycerides: 54 mg/dL (ref <150)

## 2022-11-05 LAB — IRON,TIBC AND FERRITIN PANEL
%SAT: 33 % (calc) (ref 16–45)
Ferritin: 40 ng/mL (ref 16–232)
Iron: 96 ug/dL (ref 45–160)
TIBC: 287 mcg/dL (calc) (ref 250–450)

## 2022-11-05 LAB — VITAMIN D 25 HYDROXY (VIT D DEFICIENCY, FRACTURES): Vit D, 25-Hydroxy: 113 ng/mL — ABNORMAL HIGH (ref 30–100)

## 2022-11-05 LAB — INSULIN, RANDOM: Insulin: 2.4 u[IU]/mL

## 2022-11-05 LAB — HEMOGLOBIN A1C
Hgb A1c MFr Bld: 5 % of total Hgb (ref <5.7)
Mean Plasma Glucose: 97 mg/dL
eAG (mmol/L): 5.4 mmol/L

## 2022-11-05 LAB — MICROALBUMIN / CREATININE URINE RATIO
Creatinine, Urine: 10 mg/dL — ABNORMAL LOW (ref 20–275)
Microalb, Ur: <0.2 mg/dL

## 2022-11-05 LAB — TSH: TSH: 0.83 mIU/L (ref 0.40–4.50)

## 2022-11-08 MED ORDER — MONTELUKAST SODIUM 10 MG PO TABS: ORAL_TABLET | ORAL | 3 refills | Status: DC

## 2022-12-20 ENCOUNTER — Other Ambulatory Visit: Payer: Self-pay | Admitting: Internal Medicine

## 2022-12-20 DIAGNOSIS — R300 Dysuria: Secondary | ICD-10-CM

## 2022-12-21 ENCOUNTER — Other Ambulatory Visit: Payer: Self-pay | Admitting: Internal Medicine

## 2022-12-21 DIAGNOSIS — R300 Dysuria: Secondary | ICD-10-CM

## 2022-12-21 DIAGNOSIS — N3000 Acute cystitis without hematuria: Secondary | ICD-10-CM

## 2022-12-21 MED ORDER — NITROFURANTOIN MONOHYD MACRO 100 MG PO CAPS: ORAL_CAPSULE | ORAL | 0 refills | Status: AC

## 2022-12-23 LAB — URINALYSIS, ROUTINE W REFLEX MICROSCOPIC
Bilirubin Urine: NEGATIVE
Glucose, UA: NEGATIVE
Hgb urine dipstick: NEGATIVE
Ketones, ur: NEGATIVE
Leukocytes,Ua: NEGATIVE
Nitrite: NEGATIVE
Protein, ur: NEGATIVE
Specific Gravity, Urine: 1.007 (ref 1.001–1.035)
pH: 6.5 (ref 5.0–8.0)

## 2022-12-23 LAB — URINE CULTURE
MICRO NUMBER:: 14591710
SPECIMEN QUALITY:: ADEQUATE

## 2022-12-31 ENCOUNTER — Other Ambulatory Visit: Payer: Self-pay

## 2022-12-31 DIAGNOSIS — M810 Age-related osteoporosis without current pathological fracture: Secondary | ICD-10-CM

## 2022-12-31 MED ORDER — ALENDRONATE SODIUM 70 MG PO TABS: 70.0000 mg | ORAL_TABLET | ORAL | 3 refills | Status: DC

## 2023-01-06 ENCOUNTER — Other Ambulatory Visit: Payer: Self-pay | Admitting: Nurse Practitioner

## 2023-01-06 DIAGNOSIS — M7662 Achilles tendinitis, left leg: Secondary | ICD-10-CM

## 2023-01-06 MED ORDER — MELOXICAM 15 MG PO TABS: 15.0000 mg | ORAL_TABLET | Freq: Every day | ORAL | 1 refills | Status: DC

## 2023-02-16 ENCOUNTER — Other Ambulatory Visit: Payer: Self-pay | Admitting: Internal Medicine

## 2023-03-05 ENCOUNTER — Other Ambulatory Visit: Payer: Self-pay | Admitting: Nurse Practitioner

## 2023-03-14 ENCOUNTER — Other Ambulatory Visit: Payer: Self-pay | Admitting: Nurse Practitioner

## 2023-03-28 ENCOUNTER — Other Ambulatory Visit: Payer: Self-pay | Admitting: Internal Medicine

## 2023-03-28 MED ORDER — PREDNISONE 20 MG PO TABS: ORAL_TABLET | ORAL | 0 refills | Status: DC

## 2023-04-01 ENCOUNTER — Other Ambulatory Visit: Payer: Self-pay | Admitting: Internal Medicine

## 2023-04-01 MED ORDER — PREDNISONE 20 MG PO TABS: ORAL_TABLET | ORAL | 0 refills | Status: DC

## 2023-04-27 ENCOUNTER — Other Ambulatory Visit: Payer: Self-pay | Admitting: Internal Medicine

## 2023-04-27 MED ORDER — PHENTERMINE HCL 37.5 MG PO TABS: ORAL_TABLET | ORAL | 0 refills | Status: DC

## 2023-05-10 ENCOUNTER — Other Ambulatory Visit: Payer: Self-pay | Admitting: Internal Medicine

## 2023-05-10 DIAGNOSIS — Z1231 Encounter for screening mammogram for malignant neoplasm of breast: Secondary | ICD-10-CM

## 2023-06-16 ENCOUNTER — Other Ambulatory Visit: Payer: Self-pay | Admitting: Internal Medicine

## 2023-06-16 MED ORDER — PHENTERMINE HCL 37.5 MG PO TABS: ORAL_TABLET | ORAL | 0 refills | Status: AC

## 2023-06-17 ENCOUNTER — Ambulatory Visit
Admission: RE | Admit: 2023-06-17 | Discharge: 2023-06-17 | Disposition: A | Discharge status: Home / Self Care | Payer: 59 | Source: Ambulatory Visit | Attending: Internal Medicine | Admitting: Internal Medicine

## 2023-06-17 DIAGNOSIS — Z1231 Encounter for screening mammogram for malignant neoplasm of breast: Secondary | ICD-10-CM

## 2023-06-21 ENCOUNTER — Other Ambulatory Visit: Payer: Self-pay | Admitting: Internal Medicine

## 2023-06-21 DIAGNOSIS — R928 Other abnormal and inconclusive findings on diagnostic imaging of breast: Secondary | ICD-10-CM

## 2023-07-01 ENCOUNTER — Ambulatory Visit
Admission: RE | Admit: 2023-07-01 | Discharge: 2023-07-01 | Disposition: A | Discharge status: Home / Self Care | Payer: 59 | Source: Ambulatory Visit | Attending: Internal Medicine | Admitting: Internal Medicine

## 2023-07-01 DIAGNOSIS — R928 Other abnormal and inconclusive findings on diagnostic imaging of breast: Secondary | ICD-10-CM

## 2023-07-04 ENCOUNTER — Other Ambulatory Visit: Payer: Self-pay | Admitting: Nurse Practitioner

## 2023-07-04 DIAGNOSIS — M7662 Achilles tendinitis, left leg: Secondary | ICD-10-CM

## 2023-09-28 ENCOUNTER — Other Ambulatory Visit: Payer: Self-pay | Admitting: Internal Medicine

## 2023-09-28 MED ORDER — PREDNISONE 20 MG PO TABS: ORAL_TABLET | ORAL | 1 refills | Status: AC

## 2023-10-31 ENCOUNTER — Other Ambulatory Visit: Payer: Self-pay | Admitting: Nurse Practitioner

## 2023-10-31 DIAGNOSIS — Z9109 Other allergy status, other than to drugs and biological substances: Secondary | ICD-10-CM

## 2023-11-07 ENCOUNTER — Encounter: Payer: Self-pay | Admitting: Nurse Practitioner

## 2023-11-07 ENCOUNTER — Ambulatory Visit (INDEPENDENT_AMBULATORY_CARE_PROVIDER_SITE_OTHER): Payer: 59 | Admitting: Nurse Practitioner

## 2023-11-07 VITALS — BP 134/80 | HR 79 | Temp 98.0°F | Resp 16 | Ht 62.75 in | Wt 133.0 lb

## 2023-11-07 DIAGNOSIS — E782 Mixed hyperlipidemia: Secondary | ICD-10-CM

## 2023-11-07 DIAGNOSIS — Z136 Encounter for screening for cardiovascular disorders: Secondary | ICD-10-CM | POA: Diagnosis not present

## 2023-11-07 DIAGNOSIS — I10 Essential (primary) hypertension: Secondary | ICD-10-CM

## 2023-11-07 DIAGNOSIS — K580 Irritable bowel syndrome with diarrhea: Secondary | ICD-10-CM

## 2023-11-07 DIAGNOSIS — G8929 Other chronic pain: Secondary | ICD-10-CM

## 2023-11-07 DIAGNOSIS — Z0000 Encounter for general adult medical examination without abnormal findings: Secondary | ICD-10-CM | POA: Diagnosis not present

## 2023-11-07 DIAGNOSIS — G43909 Migraine, unspecified, not intractable, without status migrainosus: Secondary | ICD-10-CM

## 2023-11-07 DIAGNOSIS — Z1329 Encounter for screening for other suspected endocrine disorder: Secondary | ICD-10-CM

## 2023-11-07 DIAGNOSIS — E663 Overweight: Secondary | ICD-10-CM

## 2023-11-07 DIAGNOSIS — M8588 Other specified disorders of bone density and structure, other site: Secondary | ICD-10-CM

## 2023-11-07 DIAGNOSIS — Z1389 Encounter for screening for other disorder: Secondary | ICD-10-CM

## 2023-11-07 DIAGNOSIS — Z860100 Personal history of colon polyps, unspecified: Secondary | ICD-10-CM

## 2023-11-07 DIAGNOSIS — Z0001 Encounter for general adult medical examination with abnormal findings: Secondary | ICD-10-CM

## 2023-11-07 DIAGNOSIS — K219 Gastro-esophageal reflux disease without esophagitis: Secondary | ICD-10-CM

## 2023-11-07 DIAGNOSIS — Z9109 Other allergy status, other than to drugs and biological substances: Secondary | ICD-10-CM

## 2023-11-07 DIAGNOSIS — Z79899 Other long term (current) drug therapy: Secondary | ICD-10-CM

## 2023-11-07 DIAGNOSIS — Z131 Encounter for screening for diabetes mellitus: Secondary | ICD-10-CM

## 2023-11-07 DIAGNOSIS — E559 Vitamin D deficiency, unspecified: Secondary | ICD-10-CM

## 2023-11-07 NOTE — Patient Instructions (Signed)

## 2023-11-07 NOTE — Progress Notes (Signed)
 Complete Physical  Assessment and Plan:  Diagnoses and all orders for this visit:  Encounter for Annual Physical Exam with abnormal findings Due annually  Health Maintenance reviewed Healthy lifestyle reviewed and goals set Continue annual mammogram via breast center  Migraine without status migrainosus, not intractable, unspecified migraine type Continue topamax  daily as prophylaxis Continue daily antihistamine, flonase  Start Singulair    Gastroesophageal reflux disease, esophagitis presence not specified No suspected reflux complications (Barret/stricture). Lifestyle modification:  wt loss, avoid meals 2-3h before bedtime. Consider eliminating food triggers:  chocolate, caffeine , EtOH, acid/spicy food.  Irritable bowel syndrome, with diarrhea Avoidance of triggers (fastfoods, etc) And PRN lomotil    Mixed hyperlipidemia Discussed lifestyle modifications. Recommended diet heavy in fruits and veggies, omega 3's. Decrease consumption of animal meats, cheeses, and dairy products. Remain active and exercise as tolerated. Continue to monitor. Check lipids/TSH   Overweight (BMI 25.0-29.9) Continue Phentermine  Discussed appropriate BMI Diet modification. Physical activity. Encouraged/praised to build confidence.  Screening for cardiovascular condition/Hx of abnormal EKG History of abnormal EKG; stable Has seen cardiology with normal ECHO 2012 Monitor   History of colon polyps UTD No new issues reported. 7 year follow up per Dr. Albertus, due 2029  Screening for hematuria or proteinuria -     Urinalysis, Complete (81001)  Vitamin D  deficiency -     VITAMIN D  25 Hydroxy (Vit-D Deficiency, Fractures) - defer, requesting minimal labs, no recent dose changes  Medication management All medications discussed and reviewed in full. All questions and concerns regarding medications addressed.     Bilateral knee pain Ortho [facility] is following; syncvasc injection  minimally beneficial meloxicam  PRN, steroid tapers helpful when needed  Osteopenia Continue Alendronate  Pursue a combination of weight-bearing exercises and strength training. Advised on fall prevention measures including proper lighting in all rooms, removal of area rugs and floor clutter, use of walking devices as deemed appropriate, avoidance of uneven walking surfaces. Smoking cessation and moderate alcohol consumption if applicable Consume 800 to 1000 IU of vitamin D  daily with a goal vitamin D  serum value of 30 ng/mL or higher. Aim for 1000 to 1200 mg of elemental calcium daily through supplements and/or dietary sources.  Environmental allergies Continue Singulair  as directed. Continue Flonase . Avoid triggers. Possible referral to Asthma & Allergy Specialist if s/s fail to improve.    Screening for hematuria or proteinuria Check and monitor Microalbumin / creatinine urine ratio Check and monitor Urinalysis, Routine w reflex microscopic  Screening for thyroid disorder Monitor TSH  Orders Placed This Encounter  Procedures   DG Bone Density    Standing Status:   Future    Expiration Date:   11/06/2024    Reason for Exam (SYMPTOM  OR DIAGNOSIS REQUIRED):   Osteopenia    Is patient pregnant?:   No    [facility]erred [facility]   CBC with Differential/Platelet   COMPLETE METABOLIC PANEL WITH GFR   Lipid panel   TSH   Hemoglobin A1c   Insulin , random   VITAMIN D  25 Hydroxy (Vit-D Deficiency, Fractures)   Urinalysis, Routine w reflex microscopic   Microalbumin / creatinine urine ratio   EKG 12-Lead   Notify office for further evaluation and treatment, questions or concerns if any reported s/s fail to improve.   The patient was advised to call back or seek an in-person evaluation if any symptoms worsen or if the condition fails to improve as anticipated.   Further disposition pending results of labs. Discussed med's effects and SE's.  I discussed the  assessment and treatment plan with the patient. The patient was provided an opportunity to ask questions and all were answered. The patient agreed with the plan and demonstrated an understanding of the instructions.  Discussed med's effects and SE's. Screening labs and tests as requested with regular follow-up as recommended.  I provided 35 minutes of face-to-face time during this encounter including counseling, chart review, and critical decision making was preformed.  Future Appointments  Date Time Provider Department Center  11/06/2024 10:00 AM Laurice President, NP [facility] None    HPI  58 y.o. female  presents for a complete physical and follow up for has Hyperlipidemia; Migraine headache; GERD; IBS; Abnormal EKG; Overweight (BMI 25.0-29.9); Osteoporosis; History of colonic polyps; History of basal cell cancer; and Osteoarthritis of both knees on their problem list.   She is married, 2 kids, 4 grand kids. She is phlebotomist at our office.   Overall she reports feeling well.  She plans to visit wither her brother today in the hospital who is currently undergoing evaluation for pulmonary disease.  Mood is overall stable.    She continues to have sinus headaches - improved by daily allergy medication, regular use of flonase . She reports 3-4 migraines a year for which she is prescribed topamax  50 mg as prophylaxis and nurtec as abortive; was doing better with injectable triptan but insurance stopped covering. Has failed imitrex  in the past.   Notices increase in allergy symptoms which can trigger migraine.  She uses Flonase  with some relief and switches OTC antihistamines, however, allergies have increasingly bothered her.  Most notable high smelling perfumes.    She has bilateral knee/foot pain, sees now following with Murphy/Wainer, mod to severe R knee tricompartmental arthritis, had synvasc injection 12/2019 but reports minimal improvement. Taking meloxicam  PRN, steroid spurts are very  helpful if needed.   GERD is rare, takes a tums if needed. IBS on lomotil  doing fairly.   BMI is Body mass index is 23.75 kg/m., she has been working on diet, exercise limited by ortho conditions.  She continues Phentermine  and Topamax .  Was successful on Mounjaro , but no longer covered.   Wt Readings from Last 3 Encounters:  11/07/23 133 lb (60.3 kg)  11/04/22 119 lb 6.4 oz (54.2 kg)  05/26/22 124 lb 12.8 oz (56.6 kg)   She had abnormal EKG, saw Dr. Lavona in 2012 with unremarkable ECHO.  Today their BP is BP: 134/80 She does workout riding stationary bike 5 miles every morning.  Walks several during work/lunch hours. She denies chest pain, shortness of breath, dizziness.   She is not on cholesterol medication and denies myalgias. Her cholesterol is not at goal. The cholesterol last visit was:   Lab Results  Component Value Date   CHOL 193 11/04/2022   HDL 75 11/04/2022   LDLCALC 104 (H) 11/04/2022   TRIG 54 11/04/2022   CHOLHDL 2.6 11/04/2022    Last A1C in the office was:  Lab Results  Component Value Date   HGBA1C 5.0 11/04/2022   Last GFR: Lab Results  Component Value Date   GFRNONAA 68 11/03/2020   Patient is on Vitamin D  supplement and at goal, 5000 IU:    Lab Results  Component Value Date   VD25OH 113 (H) 11/04/2022        Current Medications:  Current Outpatient Medications on File Prior to Visit  Medication Sig Dispense Refill   alendronate  (FOSAMAX ) 70 MG tablet Take 1 tablet (70 mg total) by  mouth every 7 (seven) days. Take with a full glass of water on an empty stomach and avoid lying down or reclining for 30 min after taking. 12 tablet 3   Ascorbic Acid (VITAMIN C PO) Take 1,000 mg by mouth daily.      Calcium Carb-Cholecalciferol (CALCIUM + D3) 600-200 MG-UNIT TABS Take 1,200 Units by mouth daily.     Cholecalciferol (VITAMIN D3) 125 MCG (5000 UT) TABS Take 1 tablet by mouth daily.     MAGNESIUM PO Take 250 mg by mouth daily.      meloxicam  (MOBIC )  15 MG tablet TAKE 1 TABLET (15 MG TOTAL) BY MOUTH DAILY. 90 tablet 1   methocarbamol  (ROBAXIN ) 500 MG tablet Take 1 tablet (500 mg total) by mouth every 6 (six) hours as needed for muscle spasms. Do not take if will be driving. 60 tablet 0   montelukast  (SINGULAIR ) 10 MG tablet TAKE 1 TABLET DAILY FOR ALLERGIES & ASTHMA 90 tablet 3   ondansetron  (ZOFRAN ) 8 MG tablet TAKE 1/2 TO 1 TABLET EVERY 6 TO 8 HOURS AS NEEDED FOR NAUSEA / VOMITING 30 tablet 3   phentermine  (ADIPEX-P ) 37.5 MG tablet Take 1/2 to 1 tablet every Morning for Dieting & Weight Loss 90 tablet 0   rizatriptan  (MAXALT ) 10 MG tablet TAKE 1 TABLET BY MOUTH AS NEEDED FOR MIGRAINE. MAY REPEAT IN 2 HOURS IF NEEDED 10 tablet 5   topiramate  (TOPAMAX ) 100 MG tablet TAKE 2 TABLETS AT BEDTIME FOR MIGRAINE PREVENTION & WEIGHT LOSS 180 tablet 2   zinc gluconate 50 MG tablet Take 50 mg by mouth daily.     diphenoxylate -atropine  (LOMOTIL ) 2.5-0.025 MG tablet Take 1 tablet by mouth 3 (three) times daily as needed for diarrhea or loose stools. (Patient not taking: Reported on 11/07/2023) 90 tablet 1   fluticasone  (FLONASE ) 50 MCG/ACT nasal spray PLACE 1-2 SPRAYS INTO BOTH NOSTRILS DAILY. (Patient not taking: Reported on 11/07/2023) 48 mL 3   nitrofurantoin , macrocrystal-monohydrate, (MACROBID ) 100 MG capsule Take 1 capsule 2 x / day with Meals for UTI (Patient not taking: Reported on 11/07/2023) 14 capsule 0   predniSONE  (DELTASONE ) 20 MG tablet 1 tab 3 x day for 3 days, then 1 tab 2 x day for 3 days, then 1 tab 1 x day for 3 days (Patient not taking: Reported on 11/07/2023) 20 tablet 1   No current facility-administered medications on file prior to visit.   Allergies:  Allergies  Allergen Reactions   Oxycodone Other (See Comments)    Hallucinations   Codeine    Dexamethasone     Medical History:  She has Hyperlipidemia; Migraine headache; GERD; IBS; Abnormal EKG; Overweight (BMI 25.0-29.9); Osteoporosis; History of colonic polyps; History of basal cell  cancer; and Osteoarthritis of both knees on their problem list. Health Maintenance:   Immunization History  Administered Date(s) Administered   Influenza Inj Mdck Quad With Preservative 10/13/2017, 08/28/2018, 08/21/2019, 08/28/2020   Influenza Split 08/27/2014   Influenza,inj,Quad PF,6+ Mos 09/07/2021   Zoster Recombinant(Shingrix) 06/24/2021, 08/27/2021   Tetanus: 2014 Flu vaccine: declines Shingrix: 2/2 2022 Covid 19: declines   Pap: s/p hysterectomy MGM: 06/2023 DEXA: 2019 - T -1.6 11/2021 -2.5 Osteoporotic Due 11/2023 - Aldendronate ECHO: 2012 -no significant abnormalities, EF 55-60%  Colonoscopy: 12/2020 due q7 colon polyp - Dr. Albertus Due 2029 EGD: n/a  Last Dental Exam: Dr. Prentiss ODESSIA Molly, last 2024, goes q55m Last Eye Exam: 07/2023, wears glasses/contacts Last Derm: Dr. Robinson, last visit few years ago, hx of R temple  basal cell from birth mark   Patient Care Team: Tonita Fallow, MD as PCP - General (Internal Medicine) Robinson Pao, MD as Consulting Physician (Dermatology) Lavona Agent, MD as Consulting Physician (Cardiology) Jakie Alm SAUNDERS, MD as Consulting Physician (Gastroenterology)  Surgical History:  She has a past surgical history that includes Vaginal hysterectomy; Tubal ligation; Oophorectomy; skin cancer removed; Polypectomy (11/09/2010); Colonoscopy (11/09/2010); Colonoscopy (2018); and Wisdom tooth extraction. Family History:  Herfamily history includes Arthritis in her father; Breast cancer in her maternal aunt and maternal aunt; Colitis in her brother; Diabetes in her father and mother; Heart attack in her maternal grandmother; Heart disease in her paternal grandfather; Heart disease (age of onset: 22) in her father; Hypertension in her mother; Kidney cancer in her father; Kidney disease in her father; Migraines in her paternal grandmother; Miscarriages / Stillbirths in her mother; Other in her paternal grandmother; Pancreatic cancer in her paternal  grandfather; Stroke in her father. Social History:  She reports that she quit smoking about 17 years ago. Her smoking use included cigarettes. She started smoking about 27 years ago. She has a 10 pack-year smoking history. She has never used smokeless tobacco. She reports current alcohol use. She reports that she does not use drugs.   Review of Systems: Review of Systems  Constitutional:  Negative for malaise/fatigue and weight loss.  HENT:  Negative for hearing loss and tinnitus.   Eyes:  Negative for blurred vision and double vision.  Respiratory:  Negative for cough, shortness of breath and wheezing.   Cardiovascular:  Negative for chest pain, palpitations, orthopnea, claudication and leg swelling.  Gastrointestinal:  Positive for diarrhea (Intermittent, associated with trigger foods). Negative for abdominal pain, blood in stool, constipation, heartburn, melena, nausea and vomiting.  Genitourinary: Negative.   Musculoskeletal:  Negative for joint pain and myalgias.  Skin:  Negative for rash.  Neurological:  Positive for headaches (Frequent sinus headaches; rare migraines). Negative for dizziness, tingling, sensory change and weakness.  Endo/Heme/Allergies:  Positive for environmental allergies. Negative for polydipsia.  Psychiatric/Behavioral: Negative.  Negative for depression and substance abuse. The patient is not nervous/anxious and does not have insomnia.   All other systems reviewed and are negative.   Physical Exam: Estimated body mass index is 23.75 kg/m as calculated from the following:   Height as of this encounter: 5' 2.75 (1.594 m).   Weight as of this encounter: 133 lb (60.3 kg). BP 134/80   Pulse 79   Temp 98 F (36.7 C)   Resp 16   Ht 5' 2.75 (1.594 m)   Wt 133 lb (60.3 kg)   SpO2 99%   BMI 23.75 kg/m  General Appearance: Well nourished, in no apparent distress.  Eyes: PERRLA, EOMs, conjunctiva no swelling or erythema Sinuses: No Frontal/maxillary tenderness   ENT/Mouth: Ext aud canals clear, normal light reflex with TMs without erythema, bulging. Good dentition. No erythema, swelling, or exudate on post pharynx. Tonsils not swollen or erythematous. Hearing normal.  Neck: Supple, thyroid normal. No bruits  Respiratory: Respiratory effort normal, BS equal bilaterally without rales, rhonchi, wheezing or stridor.  Cardio: RRR without murmurs, rubs or gallops. Brisk peripheral pulses without edema.  Chest: symmetric, with normal excursions and percussion.  Breasts: breasts appear normal, no suspicious masses, no skin or nipple changes or axillary nodes. Abdomen: Soft, nontender, no guarding, rebound, hernias, masses, or organomegaly.  Lymphatics: Non tender without lymphadenopathy.  Genitourinary: Defered; no concerns; s/p total hysterectomy Musculoskeletal: Full ROM all peripheral extremities,5/5 strength, and normal gait.  Skin: Warm, dry without rashes, lesions, ecchymosis. Neuro: Cranial nerves intact, reflexes equal bilaterally. Normal muscle tone, no cerebellar symptoms. Sensation intact.  Psych: Awake and oriented X 3, normal affect, Insight and Judgment appropriate.   EKG: Stable from previous - no ST changes   Constantina Laseter 10:16 AM [facility]

## 2023-11-08 ENCOUNTER — Encounter: Payer: 59 | Admitting: Nurse Practitioner

## 2023-11-08 LAB — TSH: TSH: 1.07 m[IU]/L (ref 0.40–4.50)

## 2023-11-08 LAB — CBC WITH DIFFERENTIAL/PLATELET
Absolute Lymphocytes: 1562 {cells}/uL (ref 850–3900)
Absolute Monocytes: 271 {cells}/uL (ref 200–950)
Basophils Absolute: 50 {cells}/uL (ref 0–200)
Basophils Relative: 0.8 %
Eosinophils Absolute: 69 {cells}/uL (ref 15–500)
Eosinophils Relative: 1.1 %
HCT: 40.5 % (ref 35.0–45.0)
Hemoglobin: 13.7 g/dL (ref 11.7–15.5)
MCH: 30.4 pg (ref 27.0–33.0)
MCHC: 33.8 g/dL (ref 32.0–36.0)
MCV: 89.8 fL (ref 80.0–100.0)
MPV: 10.1 fL (ref 7.5–12.5)
Monocytes Relative: 4.3 %
Neutro Abs: 4347 {cells}/uL (ref 1500–7800)
Neutrophils Relative %: 69 %
Platelets: 245 10*3/uL (ref 140–400)
RBC: 4.51 10*6/uL (ref 3.80–5.10)
RDW: 12.3 % (ref 11.0–15.0)
Total Lymphocyte: 24.8 %
WBC: 6.3 10*3/uL (ref 3.8–10.8)

## 2023-11-08 LAB — URINALYSIS, ROUTINE W REFLEX MICROSCOPIC
Bilirubin Urine: NEGATIVE
Glucose, UA: NEGATIVE
Hgb urine dipstick: NEGATIVE
Ketones, ur: NEGATIVE
Leukocytes,Ua: NEGATIVE
Nitrite: NEGATIVE
Protein, ur: NEGATIVE
Specific Gravity, Urine: 1.006 (ref 1.001–1.035)
pH: 6 (ref 5.0–8.0)

## 2023-11-08 LAB — COMPLETE METABOLIC PANEL WITH GFR
AG Ratio: 2.5 (calc) (ref 1.0–2.5)
ALT: 26 U/L (ref 6–29)
AST: 21 U/L (ref 10–35)
Albumin: 4.5 g/dL (ref 3.6–5.1)
Alkaline phosphatase (APISO): 68 U/L (ref 37–153)
BUN: 15 mg/dL (ref 7–25)
CO2: 24 mmol/L (ref 20–32)
Calcium: 9.3 mg/dL (ref 8.6–10.4)
Chloride: 108 mmol/L (ref 98–110)
Creat: 0.7 mg/dL (ref 0.50–1.03)
Globulin: 1.8 g/dL — ABNORMAL LOW (ref 1.9–3.7)
Glucose, Bld: 81 mg/dL (ref 65–99)
Potassium: 3.9 mmol/L (ref 3.5–5.3)
Sodium: 141 mmol/L (ref 135–146)
Total Bilirubin: 0.3 mg/dL (ref 0.2–1.2)
Total Protein: 6.3 g/dL (ref 6.1–8.1)
eGFR: 100 mL/min/{1.73_m2} (ref >=60)

## 2023-11-08 LAB — MICROALBUMIN / CREATININE URINE RATIO
Creatinine, Urine: 30 mg/dL (ref 20–275)
Microalb, Ur: <0.2 mg/dL

## 2023-11-08 LAB — LIPID PANEL
Cholesterol: 203 mg/dL — ABNORMAL HIGH (ref <200)
HDL: 66 mg/dL (ref >=50)
LDL Cholesterol (Calc): 119 mg/dL — ABNORMAL HIGH
Non-HDL Cholesterol (Calc): 137 mg/dL — ABNORMAL HIGH (ref <130)
Total CHOL/HDL Ratio: 3.1 (calc) (ref <5.0)
Triglycerides: 84 mg/dL (ref <150)

## 2023-11-08 LAB — INSULIN, RANDOM: Insulin: 5.8 u[IU]/mL

## 2023-11-08 LAB — HEMOGLOBIN A1C
Hgb A1c MFr Bld: 5.2 %{Hb} (ref <5.7)
Mean Plasma Glucose: 103 mg/dL
eAG (mmol/L): 5.7 mmol/L

## 2023-11-08 LAB — VITAMIN D 25 HYDROXY (VIT D DEFICIENCY, FRACTURES): Vit D, 25-Hydroxy: 91 ng/mL (ref 30–100)

## 2023-11-29 ENCOUNTER — Other Ambulatory Visit: Payer: Self-pay | Admitting: Nurse Practitioner

## 2023-11-29 DIAGNOSIS — M810 Age-related osteoporosis without current pathological fracture: Secondary | ICD-10-CM

## 2023-12-14 ENCOUNTER — Other Ambulatory Visit: Payer: Self-pay

## 2023-12-14 MED ORDER — TOPIRAMATE 100 MG PO TABS: ORAL_TABLET | ORAL | 2 refills | Status: AC

## 2024-04-19 ENCOUNTER — Other Ambulatory Visit: Payer: Self-pay | Admitting: Family Medicine

## 2024-04-19 DIAGNOSIS — Z1231 Encounter for screening mammogram for malignant neoplasm of breast: Secondary | ICD-10-CM

## 2024-06-19 ENCOUNTER — Ambulatory Visit
Admission: RE | Admit: 2024-06-19 | Discharge: 2024-06-19 | Disposition: A | Discharge status: Home / Self Care | Source: Ambulatory Visit | Attending: Family Medicine | Admitting: Family Medicine

## 2024-06-19 DIAGNOSIS — Z1231 Encounter for screening mammogram for malignant neoplasm of breast: Secondary | ICD-10-CM

## 2024-07-11 ENCOUNTER — Other Ambulatory Visit (HOSPITAL_BASED_OUTPATIENT_CLINIC_OR_DEPARTMENT_OTHER)

## 2024-07-11 ENCOUNTER — Ambulatory Visit
Admission: RE | Admit: 2024-07-11 | Discharge: 2024-07-11 | Disposition: A | Discharge status: Home / Self Care | Source: Ambulatory Visit | Attending: Nurse Practitioner | Admitting: Nurse Practitioner

## 2024-07-11 DIAGNOSIS — M8588 Other specified disorders of bone density and structure, other site: Secondary | ICD-10-CM | POA: Diagnosis present

## 2024-07-13 ENCOUNTER — Other Ambulatory Visit: Payer: 59

## 2024-09-18 ENCOUNTER — Other Ambulatory Visit: Payer: Self-pay | Admitting: Family

## 2024-11-06 ENCOUNTER — Encounter: Payer: 59 | Admitting: Nurse Practitioner
